# Patient Record
Sex: Female | Born: 1990 | Race: White | Hispanic: No | Marital: Single | Smoking: Former smoker
Health system: Southern US, Community
[De-identification: ages and names within clinical notes are randomized; demographics above are authoritative.]

## PROBLEM LIST (undated history)

## (undated) DIAGNOSIS — M5432 Sciatica, left side: Secondary | ICD-10-CM

## (undated) DIAGNOSIS — M5431 Sciatica, right side: Secondary | ICD-10-CM

## (undated) HISTORY — PX: TUBAL LIGATION: SHX77

## (undated) HISTORY — PX: ABDOMINAL SURGERY: SHX537

---
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
## 2010-03-18 ENCOUNTER — Emergency Department: Payer: Self-pay | Admitting: Emergency Medicine

## 2010-03-22 ENCOUNTER — Emergency Department: Payer: Self-pay | Admitting: Internal Medicine

## 2010-07-29 ENCOUNTER — Emergency Department: Payer: Self-pay | Admitting: Emergency Medicine

## 2010-08-15 ENCOUNTER — Emergency Department: Payer: Self-pay | Admitting: Emergency Medicine

## 2010-08-16 ENCOUNTER — Emergency Department: Payer: Self-pay | Admitting: Emergency Medicine

## 2011-02-17 ENCOUNTER — Ambulatory Visit: Payer: Self-pay | Admitting: Ophthalmology

## 2011-03-11 ENCOUNTER — Emergency Department: Payer: Self-pay | Admitting: Emergency Medicine

## 2011-08-09 ENCOUNTER — Emergency Department: Payer: Self-pay | Admitting: Emergency Medicine

## 2011-11-25 ENCOUNTER — Emergency Department: Payer: Self-pay | Admitting: *Deleted

## 2011-11-25 LAB — URINALYSIS, COMPLETE
Bacteria: NONE SEEN
Bilirubin,UR: NEGATIVE
Blood: NEGATIVE
Glucose,UR: NEGATIVE mg/dL (ref 0–75)
Ketone: NEGATIVE
Ph: 5 (ref 4.5–8.0)
Specific Gravity: 1.023 (ref 1.003–1.030)
Squamous Epithelial: 3
WBC UR: 4 /HPF (ref 0–5)

## 2011-11-25 LAB — COMPREHENSIVE METABOLIC PANEL
Albumin: 3.7 g/dL (ref 3.4–5.0)
Alkaline Phosphatase: 35 U/L — ABNORMAL LOW (ref 50–136)
Anion Gap: 10 (ref 7–16)
BUN: 15 mg/dL (ref 7–18)
Bilirubin,Total: 0.3 mg/dL (ref 0.2–1.0)
Calcium, Total: 8.5 mg/dL (ref 8.5–10.1)
Chloride: 107 mmol/L (ref 98–107)
Co2: 24 mmol/L (ref 21–32)
EGFR (African American): >60
EGFR (Non-African Amer.): >60
Osmolality: 282 (ref 275–301)
SGPT (ALT): 19 U/L
Sodium: 141 mmol/L (ref 136–145)
Total Protein: 7.4 g/dL (ref 6.4–8.2)

## 2011-11-25 LAB — CBC
HGB: 13.5 g/dL (ref 12.0–16.0)
MCHC: 34.1 g/dL (ref 32.0–36.0)
MCV: 94 fL (ref 80–100)
Platelet: 241 10*3/uL (ref 150–440)
RBC: 4.23 10*6/uL (ref 3.80–5.20)
WBC: 6.2 10*3/uL (ref 3.6–11.0)

## 2011-11-26 ENCOUNTER — Emergency Department: Payer: Self-pay | Admitting: Unknown Physician Specialty

## 2011-11-26 LAB — URINALYSIS, COMPLETE
Bilirubin,UR: NEGATIVE
Ketone: NEGATIVE
Leukocyte Esterase: NEGATIVE
Ph: 5 (ref 4.5–8.0)
Protein: NEGATIVE
RBC,UR: NONE SEEN /HPF (ref 0–5)
Squamous Epithelial: 2
WBC UR: 3 /HPF (ref 0–5)

## 2011-11-26 LAB — COMPREHENSIVE METABOLIC PANEL
Albumin: 3.5 g/dL (ref 3.4–5.0)
Anion Gap: 9 (ref 7–16)
Bilirubin,Total: 0.9 mg/dL (ref 0.2–1.0)
Calcium, Total: 8.6 mg/dL (ref 8.5–10.1)
Chloride: 106 mmol/L (ref 98–107)
Co2: 27 mmol/L (ref 21–32)
Creatinine: 0.76 mg/dL (ref 0.60–1.30)
EGFR (African American): >60
EGFR (Non-African Amer.): >60
Osmolality: 283 (ref 275–301)
Potassium: 3.6 mmol/L (ref 3.5–5.1)
SGOT(AST): 16 U/L (ref 15–37)
SGPT (ALT): 19 U/L

## 2011-11-26 LAB — CBC
HCT: 39.9 % (ref 35.0–47.0)
HGB: 13.4 g/dL (ref 12.0–16.0)
MCH: 31.5 pg (ref 26.0–34.0)
MCHC: 33.6 g/dL (ref 32.0–36.0)
MCV: 94 fL (ref 80–100)
Platelet: 226 10*3/uL (ref 150–440)
RBC: 4.25 10*6/uL (ref 3.80–5.20)
RDW: 13.3 % (ref 11.5–14.5)

## 2011-11-26 LAB — TSH: Thyroid Stimulating Horm: 3.36 u[IU]/mL

## 2011-11-26 LAB — PREGNANCY, URINE: Pregnancy Test, Urine: NEGATIVE m[IU]/mL

## 2011-11-26 LAB — DRUG SCREEN, URINE
Barbiturates, Ur Screen: NEGATIVE (ref ?–200)
Benzodiazepine, Ur Scrn: NEGATIVE (ref ?–200)
Cannabinoid 50 Ng, Ur ~~LOC~~: NEGATIVE (ref ?–50)
Cocaine Metabolite,Ur ~~LOC~~: NEGATIVE (ref ?–300)
Methadone, Ur Screen: NEGATIVE (ref ?–300)
Phencyclidine (PCP) Ur S: NEGATIVE (ref ?–25)
Tricyclic, Ur Screen: NEGATIVE (ref ?–1000)

## 2013-08-17 ENCOUNTER — Emergency Department: Payer: Self-pay | Admitting: Emergency Medicine

## 2013-08-17 LAB — BASIC METABOLIC PANEL
Anion Gap: 5 — ABNORMAL LOW (ref 7–16)
Calcium, Total: 8.9 mg/dL (ref 8.5–10.1)
Co2: 26 mmol/L (ref 21–32)
Creatinine: 0.67 mg/dL (ref 0.60–1.30)
EGFR (African American): >60
EGFR (Non-African Amer.): >60
Osmolality: 276 (ref 275–301)
Sodium: 139 mmol/L (ref 136–145)

## 2013-08-17 LAB — URINALYSIS, COMPLETE
Blood: NEGATIVE
Ketone: NEGATIVE
Leukocyte Esterase: NEGATIVE
Nitrite: NEGATIVE
Ph: 7 (ref 4.5–8.0)
Protein: NEGATIVE
Squamous Epithelial: 4
WBC UR: 2 /HPF (ref 0–5)

## 2013-08-17 LAB — PREGNANCY, URINE: Pregnancy Test, Urine: NEGATIVE m[IU]/mL

## 2013-08-17 LAB — CBC
MCHC: 33.6 g/dL (ref 32.0–36.0)
Platelet: 230 10*3/uL (ref 150–440)
RDW: 13.9 % (ref 11.5–14.5)

## 2013-09-18 ENCOUNTER — Ambulatory Visit: Payer: Self-pay | Admitting: Physician Assistant

## 2014-06-11 ENCOUNTER — Ambulatory Visit: Payer: Self-pay | Admitting: Family Medicine

## 2014-10-21 ENCOUNTER — Emergency Department: Payer: Self-pay | Admitting: Emergency Medicine

## 2014-10-21 LAB — CBC WITH DIFFERENTIAL/PLATELET
Basophil #: 0 10*3/uL (ref 0.0–0.1)
Basophil %: 0.4 %
EOS PCT: 0.9 %
Eosinophil #: 0.1 10*3/uL (ref 0.0–0.7)
HCT: 37.7 % (ref 35.0–47.0)
HGB: 12.7 g/dL (ref 12.0–16.0)
LYMPHS PCT: 12.9 %
Lymphocyte #: 1.5 10*3/uL (ref 1.0–3.6)
MCH: 32.2 pg (ref 26.0–34.0)
MCHC: 33.7 g/dL (ref 32.0–36.0)
MCV: 96 fL (ref 80–100)
Monocyte #: 0.8 x10 3/mm (ref 0.2–0.9)
Monocyte %: 6.6 %
Neutrophil #: 9.4 10*3/uL — ABNORMAL HIGH (ref 1.4–6.5)
Neutrophil %: 79.2 %
Platelet: 212 10*3/uL (ref 150–440)
RBC: 3.94 10*6/uL (ref 3.80–5.20)
RDW: 13 % (ref 11.5–14.5)
WBC: 11.8 10*3/uL — ABNORMAL HIGH (ref 3.6–11.0)

## 2014-10-21 LAB — COMPREHENSIVE METABOLIC PANEL
ALBUMIN: 2.3 g/dL — AB (ref 3.4–5.0)
ANION GAP: 7 (ref 7–16)
AST: 18 U/L (ref 15–37)
Alkaline Phosphatase: 120 U/L — ABNORMAL HIGH
BUN: 5 mg/dL — AB (ref 7–18)
Bilirubin,Total: 0.4 mg/dL (ref 0.2–1.0)
CALCIUM: 8.2 mg/dL — AB (ref 8.5–10.1)
CHLORIDE: 107 mmol/L (ref 98–107)
Co2: 25 mmol/L (ref 21–32)
Creatinine: 0.57 mg/dL — ABNORMAL LOW (ref 0.60–1.30)
EGFR (African American): >60
EGFR (Non-African Amer.): >60
GLUCOSE: 87 mg/dL (ref 65–99)
OSMOLALITY: 274 (ref 275–301)
Potassium: 3.8 mmol/L (ref 3.5–5.1)
SGPT (ALT): 20 U/L
Sodium: 139 mmol/L (ref 136–145)
TOTAL PROTEIN: 5.8 g/dL — AB (ref 6.4–8.2)

## 2014-10-21 LAB — URINALYSIS, COMPLETE
BILIRUBIN, UR: NEGATIVE
Blood: NEGATIVE
Glucose,UR: NEGATIVE mg/dL (ref 0–75)
Ketone: NEGATIVE
Leukocyte Esterase: NEGATIVE
Nitrite: NEGATIVE
Ph: 7 (ref 4.5–8.0)
Protein: NEGATIVE
RBC,UR: 1 /HPF (ref 0–5)
SPECIFIC GRAVITY: 1.009 (ref 1.003–1.030)
Squamous Epithelial: 1

## 2014-10-21 LAB — MAGNESIUM: Magnesium: 1.7 mg/dL — ABNORMAL LOW

## 2014-10-23 LAB — URINE CULTURE

## 2014-11-28 ENCOUNTER — Inpatient Hospital Stay: Payer: Self-pay

## 2014-11-28 LAB — CBC WITH DIFFERENTIAL/PLATELET
Basophil #: 0.1 10*3/uL (ref 0.0–0.1)
Basophil %: 0.4 %
EOS PCT: 0.8 %
Eosinophil #: 0.1 10*3/uL (ref 0.0–0.7)
HCT: 39 % (ref 35.0–47.0)
HGB: 13.2 g/dL (ref 12.0–16.0)
Lymphocyte #: 1.8 10*3/uL (ref 1.0–3.6)
Lymphocyte %: 15.3 %
MCH: 32.5 pg (ref 26.0–34.0)
MCHC: 33.8 g/dL (ref 32.0–36.0)
MCV: 96 fL (ref 80–100)
MONO ABS: 1.1 x10 3/mm — AB (ref 0.2–0.9)
Monocyte %: 9.4 %
NEUTROS PCT: 74.1 %
Neutrophil #: 8.5 10*3/uL — ABNORMAL HIGH (ref 1.4–6.5)
PLATELETS: 219 10*3/uL (ref 150–440)
RBC: 4.05 10*6/uL (ref 3.80–5.20)
RDW: 13.4 % (ref 11.5–14.5)
WBC: 11.5 10*3/uL — AB (ref 3.6–11.0)

## 2014-11-29 LAB — GC/CHLAMYDIA PROBE AMP

## 2014-12-01 LAB — HEMATOCRIT: HCT: 38.2 % (ref 35.0–47.0)

## 2015-02-10 ENCOUNTER — Ambulatory Visit: Payer: Self-pay | Admitting: Obstetrics & Gynecology

## 2015-02-17 ENCOUNTER — Ambulatory Visit: Payer: Self-pay | Admitting: Obstetrics & Gynecology

## 2015-03-22 NOTE — Op Note (Signed)
PATIENT NAME:  Kara SchwabBUCKNER, Shakesha L MR#:  [id number] DATE OF BIRTH:  [birth year]  DATE OF PROCEDURE:  02/17/2015  POSTOPERATIVE DIAGNOSIS:  Desire for permanent sterility.   POSTOPERATIVE DIAGNOSIS:   Desire for permanent sterility.   PROCEDURE: Laparoscopy, bilateral tubal ligation using Hulka clips.   SURGEON: Annamarie MajorPaul Euclid Cassetta, MD   ASSISTANT: None.   ANESTHESIA: General.   ESTIMATED BLOOD LOSS: Minimal.   COMPLICATIONS: None.   FINDINGS: Normal tubes, ovaries, and uterus.   DISPOSITION: To recovery room stable.   PROCEDURE IN DETAIL:   The patient is prepped and draped in the usual sterile fashion after adequate anesthesia is obtained in the dorsal lithotomy position. Bladder is drained with a Robinson catheter and sponge stick is placed for manipulation purposes.   Attention is then turned to abdomen, where a Veress needle is inserted through a 5 mm infraumbilical incision after Marcaine is used to anesthetize the skin. Veress needle placement is confirmed using the hanging drop technique and the abdomen is then insufflated with CO2 gas. A 5 mm trocar is then inserted under direct visualization with the laparoscope with no injuries or  bleeding noted. The patient was turned in Trendelenburg position and the above-mentioned findings are visualized. An 11 mm suprapubic port is placed under direct visualization with the laparoscope with no injuries or bleeding noted. A 5 mm right lower quadrant trocar is placed lateral to the inferior epigastric blood vessels with no injuries or bleeding noted. The left fallopian tube is identified out to its fimbria and then in the midportion, a Hulka clip is placed followed by a second just lateral to the first. The right fallopian tube was identified out to its fimbria and a Hulka clip is placed in the midportion of the tube with a 2nd one placed just lateral to the 1st. Excellent placement is noted. No bleeding is noted. No injuries to bowel, bladder, ureter, or  other structures are encountered. The patient is leveled, gas is expelled, and trocars are removed. The fascia at the suprapubic site is closed with a 2-0 Vicryl suture and the skin is closed with Dermabond. The patient goes to the recovery room in stable condition. All sponge, instrument, and needle counts are correct.      ____________________________ R. Annamarie MajorPaul Taron Mondor, MD rph:tr D: 02/17/2015 11:04:42 ET T: 02/17/2015 12:19:32 ET JOB#: [id number]  cc: Dierdre Searles. Paul Teigan Sahli, MD, <Dictator> Nadara MustardOBERT P Add Dinapoli MD ELECTRONICALLY SIGNED 02/17/2015 13:47

## 2015-03-31 NOTE — H&P (Signed)
L&D Evaluation:  History Expanded:  HPI 24 yo G1P0 at 5041 weeks EGA in uncomplicated pregnancy w Prenatal Care at [facility] who presents for Induction of labor due to Post Dates.   Gravida 1   Term 0   Blood Type (Maternal) A positive   Group B Strep Results Maternal (Result >5wks must be treated as unknown) negative   Maternal [facility]Dap Immune   [facility]   Patient's Medical History No Chronic Illness   Patient's Surgical History none   Medications Pre Natal Vitamins   Allergies NKDA   Social History none   Family History Non-Contributory   ROS:  ROS All systems were reviewed.  HEENT, CNS, GI, GU, Respiratory, CV, Renal and Musculoskeletal systems were found to be normal.   Exam:  Vital Signs stable   General no apparent distress   Mental Status clear   Abdomen gravid, non-tender   Estimated Fetal Weight Average for gestational age   Back no CVAT   Edema no edema   Pelvic no external lesions, C/75/-3   Mebranes Intact   FHT normal rate with no decels   Ucx rare   Skin dry   Impression:  Impression Induction of labor for Post Dates   Plan:  Plan EFM/NST, fluids   Comments Cervadil for Induction of labor, pros and cons discussed. Pt has been fully informed of the pros and cons, risk/benefits continued close observation versus the risks of induction. She understands that there are uncommon risks to induction, which include but are not limited to:  frequent and/or prolonged uterine contractions, fetal distress, uterine rupture and lack of success of induction.  She also has been informed that if the induction is not successful a Cesarean Section may be necessary.  All questions have been answered and she is in agreement with induction.   Electronic Signatures: Letitia LibraHarris, Saajan Willmon Paul (MD)  (Signed 08-Jan-16 21:03)  Authored: L&D Evaluation   Last Updated: 08-Jan-16 21:03 by Letitia LibraHarris, Deosha Werden Paul (MD)

## 2017-08-10 ENCOUNTER — Encounter: Payer: Self-pay | Admitting: Emergency Medicine

## 2017-08-10 ENCOUNTER — Emergency Department: Payer: Medicaid Other

## 2017-08-10 ENCOUNTER — Emergency Department
Admission: EM | Admit: 2017-08-10 | Discharge: 2017-08-10 | Disposition: A | Discharge status: Home / Self Care | Payer: Medicaid Other | Attending: Internal Medicine | Admitting: Internal Medicine

## 2017-08-10 DIAGNOSIS — R079 Chest pain, unspecified: Secondary | ICD-10-CM | POA: Insufficient documentation

## 2017-08-10 DIAGNOSIS — F419 Anxiety disorder, unspecified: Secondary | ICD-10-CM | POA: Diagnosis not present

## 2017-08-10 DIAGNOSIS — F17200 Nicotine dependence, unspecified, uncomplicated: Secondary | ICD-10-CM | POA: Diagnosis not present

## 2017-08-10 LAB — BASIC METABOLIC PANEL
Anion gap: 8 (ref 5–15)
BUN: 10 mg/dL (ref 6–20)
CALCIUM: 9.3 mg/dL (ref 8.9–10.3)
CO2: 24 mmol/L (ref 22–32)
Chloride: 106 mmol/L (ref 101–111)
Creatinine, Ser: 0.65 mg/dL (ref 0.44–1.00)
GFR calc Af Amer: >60 mL/min (ref >60)
GLUCOSE: 112 mg/dL — AB (ref 65–99)
Potassium: 3.8 mmol/L (ref 3.5–5.1)
Sodium: 138 mmol/L (ref 135–145)

## 2017-08-10 LAB — TROPONIN I

## 2017-08-10 LAB — CBC
HCT: 43.1 % (ref 35.0–47.0)
Hemoglobin: 15 g/dL (ref 12.0–16.0)
MCH: 33.7 pg (ref 26.0–34.0)
MCHC: 34.7 g/dL (ref 32.0–36.0)
MCV: 97.1 fL (ref 80.0–100.0)
Platelets: 226 10*3/uL (ref 150–440)
RBC: 4.44 MIL/uL (ref 3.80–5.20)
RDW: 12.5 % (ref 11.5–14.5)
WBC: 6.5 10*3/uL (ref 3.6–11.0)

## 2017-08-10 MED ORDER — HYDROXYZINE PAMOATE 25 MG PO CAPS: 25.0000 mg | ORAL_CAPSULE | Freq: Three times a day (TID) | ORAL | 0 refills | Status: AC | PRN

## 2017-08-10 NOTE — Discharge Instructions (Signed)
Your exam, labs, chest x-ray, and EKG were all normal today. You have been experiencing atypical chest pain and anxiety. Your heart is healthy and your lungs are clear. You are being provided with a prescription which may relieve your symptoms. Consider taking the medicine at bedtime. Follow-up with one of the local community clinic for ongoing symptom management.

## 2017-08-10 NOTE — ED Triage Notes (Signed)
Pt to ed with c/o chest pain that started this am, reports intermittent chest pain over the last 2 years.  States unknown onset.  States pain is worse with deep breath.

## 2017-08-10 NOTE — ED Provider Notes (Signed)
[facility] Emergency Department Provider Note ____________________________________________  Time seen: 1331  I have reviewed the triage vital signs and the nursing notes.  HISTORY  Chief Complaint  Chest Pain  History & exam as performed by Rose Clousing, PA-S Sherrie Sport)  HPI Kara Ford is a 26 y.o. female Presents to the ED with complaints of central and left substernal chest pain that started this morning. Patient reports intermittent episodes of chest pain similar to this one over the last 2-3 years.She denies any recent illness, injury, trauma. She also denies any diaphoresis, nausea, vomiting, or syncope. She denies any prodrome preceding chest pain and denies any alleviating or aggravating factors. She describes the pain as sharp & stabbing, lasting anywhere from 2-3 minutes to constant, throughout the day. She denies any correlation between activity, eating, or movement. She gives a remote history of a cardiac work-up including a holter monitor, but she reports a negative work-up.   History reviewed. No pertinent past medical history.  There are no active problems to display for this patient.  History reviewed. No pertinent surgical history.  Prior to Admission medications   Medication Sig Start Date End Date Taking? Authorizing Provider  hydrOXYzine (VISTARIL) 25 MG capsule Take 1 capsule (25 mg total) by mouth every 8 (eight) hours as needed for anxiety. 08/10/17 08/20/17  Demetre Monaco, Charlesetta Ivory, PA-C    Allergies Patient has no known allergies.  History reviewed. No pertinent family history.  Social History Social History  Substance Use Topics  . Smoking status: Current Every Day Smoker  . Smokeless tobacco: Never Used  . Alcohol use Yes    Review of Systems  Constitutional: Negative for fever. Eyes: Negative for visual changes. ENT: Negative for sore throat. Cardiovascular: Positive for chest pain. Respiratory: Negative for  shortness of breath. Gastrointestinal: Negative for abdominal pain, vomiting and diarrhea. Musculoskeletal: Negative for back pain. Chest wall tenderness  Skin: Negative for rash. Neurological: Negative for headaches, focal weakness or numbness. ____________________________________________  PHYSICAL EXAM:  VITAL SIGNS: ED Triage Vitals  Enc Vitals Group     BP 08/10/17 1103 138/76     Pulse Rate 08/10/17 1103 95     Resp 08/10/17 1103 18     Temp 08/10/17 1103 98.2 F (36.8 C)     Temp Source 08/10/17 1103 Oral     SpO2 08/10/17 1103 100 %     Weight 08/10/17 1103 165 lb (74.8 kg)     Height --      Head Circumference --      Peak Flow --      Pain Score 08/10/17 1102 6     Pain Loc --      Pain Edu? --      Excl. in GC? --     Constitutional: Alert and oriented. Well appearing and in no distress. Head: Normocephalic and atraumatic. Neck: Supple. No thyromegaly. Hematological/Lymphatic/Immunological: No cervical lymphadenopathy. Cardiovascular: Normal rate, regular rhythm. Normal distal pulses. No murmur, rubs, gallops. No LE edema noted.  Respiratory: Normal respiratory effort. No wheezes/rales/rhonchi. Gastrointestinal: Soft and nontender. No distention, rebound, guarding, or organomegaly. Normal bowel sounds. No inspiratory halt.  Musculoskeletal: Nontender with normal range of motion in all extremities. Mildly tender to palp over the left lower rib cage.  Neurologic:  Normal gait without ataxia. Normal speech and language. No gross focal neurologic deficits are appreciated. Skin:  Skin is warm, dry and intact. No rash noted. Psychiatric: Mood and affect are normal. Patient exhibits appropriate  insight and judgment. ____________________________________________   LABS (pertinent positives/negatives)  Labs Reviewed  BASIC METABOLIC PANEL - Abnormal; Notable for the following:       Result Value   Glucose, Bld 112 (*)    All other components within normal limits  CBC   TROPONIN I  ____________________________________________  EKG  NSR with sinus arrhythmia Vent rate 90 bpm Normal Axis No STEMI ____________________________________________   RADIOLOGY  CXR  IMPRESSION: Negative chest ____________________________________________  INITIAL IMPRESSION / ASSESSMENT AND PLAN / ED COURSE  Patient with the ED evaluation of chronic, intermittent chest wall pain on presentation. She presents with left-sided substernal chest wall pain. Patient denies any shortness of breath, diaphoresis, nausea, vomiting, or upper extremities referral. Her exam today is overall benign. Her labs are reassuring at this time including a negative troponin. Patient's EKG reveals normal sinus rhythm with a sinus arrhythmia. She is reassured by her exam, labs, and EKG. Patient likely is experiencing intermittent chest pain that does not appear to be cardiac in nature. Symptoms may represent a response to stress and anxiety. She will be discharged at this time with a prescription for hydroxyzine to dose as directed for anxiety and sleep induction. She is referred to the local clinic clinics for ongoing evaluation and symptom management. Return precautions are reviewed. ____________________________________________  FINAL CLINICAL IMPRESSION(S) / ED DIAGNOSES  Final diagnoses:  Nonspecific chest pain  Anxiety      Karmen Stabs, Charlesetta Ivory, PA-C 08/10/17 1921    Emily Filbert, MD 08/22/17 [phone]

## 2017-08-10 NOTE — ED Notes (Signed)
See triage note  States she has had intermittent chest pain for about 2 years  Not sure what causes or stop the pain  States she has not taken any meds for this   Pain increases at time with inspiration   No fever or trauma   Afebrile on arrival

## 2017-10-21 ENCOUNTER — Other Ambulatory Visit: Payer: Self-pay

## 2017-10-21 ENCOUNTER — Emergency Department
Admission: EM | Admit: 2017-10-21 | Discharge: 2017-10-21 | Disposition: A | Discharge status: Home / Self Care | Payer: Medicaid Other | Attending: Emergency Medicine | Admitting: Emergency Medicine

## 2017-10-21 ENCOUNTER — Encounter: Payer: Self-pay | Admitting: Emergency Medicine

## 2017-10-21 DIAGNOSIS — F17200 Nicotine dependence, unspecified, uncomplicated: Secondary | ICD-10-CM | POA: Diagnosis not present

## 2017-10-21 DIAGNOSIS — R05 Cough: Secondary | ICD-10-CM | POA: Diagnosis not present

## 2017-10-21 DIAGNOSIS — J029 Acute pharyngitis, unspecified: Secondary | ICD-10-CM | POA: Diagnosis present

## 2017-10-21 DIAGNOSIS — Z79899 Other long term (current) drug therapy: Secondary | ICD-10-CM | POA: Diagnosis not present

## 2017-10-21 DIAGNOSIS — R059 Cough, unspecified: Secondary | ICD-10-CM

## 2017-10-21 LAB — POCT RAPID STREP A: STREPTOCOCCUS, GROUP A SCREEN (DIRECT): NEGATIVE

## 2017-10-21 MED ORDER — GUAIFENESIN-CODEINE 100-10 MG/5ML PO SOLN: 10.0000 mL | Freq: Three times a day (TID) | ORAL | 0 refills | Status: DC | PRN

## 2017-10-21 MED ORDER — AMOXICILLIN 500 MG PO TABS: 500.0000 mg | ORAL_TABLET | Freq: Three times a day (TID) | ORAL | 0 refills | Status: DC

## 2017-10-21 NOTE — Discharge Instructions (Signed)
Follow up with the primary care provider of your choice for symptoms that are not resolving over the week.  Return to the ER for symptoms that change or worsen if unable to schedule an appointment.

## 2017-10-21 NOTE — ED Triage Notes (Signed)
Pt c/o cough X 1 week and started with sore throat yesterday AM. No known fevers.  reports tonsils swollen.  Denies pain at this time.  Ambulatory. NAD. VSS. Handling secretions. Unlabored.

## 2017-10-21 NOTE — ED Provider Notes (Signed)
[facility] Emergency Department Provider Note  ____________________________________________  Time seen: Approximately 9:29 AM  I have reviewed the triage vital signs and the nursing notes.   HISTORY  Chief Complaint Cough and Sore Throat   HPI Kara Ford is a 26 y.o. female who presents to the emergency department for evaluation and treatment of cough x 1 week and sore throat that started yesterday. She states that she is "sure it's strep throat" because she has had it many times and saw "craters" on her tonsils this morning. Cough is productive of clear/yellow mucus. She has not taken anything OTC for her symptoms.    History reviewed. No pertinent past medical history.  There are no active problems to display for this patient.   History reviewed. No pertinent surgical history.  Prior to Admission medications   Medication Sig Start Date End Date Taking? Authorizing Provider  Prenatal Vit-Fe Fumarate-FA (MULTIVITAMIN-PRENATAL) 27-0.8 MG TABS tablet Take 1 tablet by mouth daily at 12 noon.   Yes [provider]  amoxicillin (AMOXIL) 500 MG tablet Take 1 tablet (500 mg total) by mouth 3 (three) times daily. 10/21/17   Phillip Maffei B, FNP  guaiFENesin-codeine 100-10 MG/5ML syrup Take 10 mLs by mouth 3 (three) times daily as needed. 10/21/17   Chinita Pesterriplett, Cherrie Franca B, FNP    Allergies Patient has no known allergies.  History reviewed. No pertinent family history.  Social History Social History   Tobacco Use  . Smoking status: Current Every Day Smoker  . Smokeless tobacco: Never Used  Substance Use Topics  . Alcohol use: Yes  . Drug use: No    Review of Systems Constitutional: Negative for fever/chills ENT: Positive sore throat. Cardiovascular: Denies chest pain. Respiratory: Negative for shortness of breath. Positive for cough. Gastrointestinal: Negative for nausea,  no vomiting.  No diarrhea.  Musculoskeletal: Negative for body  aches Skin: Negative for rash. Neurological: Negative for headaches ____________________________________________   PHYSICAL EXAM:  VITAL SIGNS: ED Triage Vitals [10/21/17 0905]  Enc Vitals Group     BP 124/65     Pulse Rate 79     Resp 16     Temp 98.4 F (36.9 C)     Temp Source Oral     SpO2 97 %     Weight 165 lb (74.8 kg)     Height 5\' 7"  (1.702 m)     Head Circumference      Peak Flow      Pain Score      Pain Loc      Pain Edu?      Excl. in GC?     Constitutional: Alert and oriented. Well appearing and in no acute distress. Eyes: Conjunctivae are normal. EOMI. Ears: bilateral TM normal Nose: No congestion noted; no rhinnorhea. Mouth/Throat: Mucous membranes are moist.  Oropharynx erythematous. Tonsils 1+without exudate. Neck: No stridor.  Lymphatic: No cervical lymphadenopathy. Cardiovascular: Normal rate, regular rhythm. Good peripheral circulation. Respiratory: Normal respiratory effort.  No retractions. Clear to auscultation. Gastrointestinal: Soft and nontender.  Musculoskeletal: FROM x 4 extremities.  Neurologic:  Normal speech and language.  Skin:  Skin is warm, dry and intact. No rash noted. Psychiatric: Mood and affect are normal. Speech and behavior are normal.  ____________________________________________   LABS (all labs ordered are listed, but only abnormal results are displayed)  Labs Reviewed  POCT RAPID STREP A   ____________________________________________  EKG  Not indicated. ____________________________________________  RADIOLOGY  Not indicated. ____________________________________________   PROCEDURES  Procedure(s) performed: None  Critical Care performed: No ____________________________________________   INITIAL IMPRESSION / ASSESSMENT AND PLAN / ED COURSE  26 year old female presenting to the ER for treatment of cough and sore throat. She is not reassured by a negative rapid strep screen as she feels confident that  she has strep throat. Therefore, she will be treated with amoxicillin and given Robitussin AC for cough. She was instructed to follow up with the PCP of her choice for symptoms that are not improving over the next few days or return to the ER for symptoms that change or worsen if unable to schedule an appointment.  Medications - No data to display  ED Discharge Orders        Ordered    amoxicillin (AMOXIL) 500 MG tablet  3 times daily     10/21/17 0937    guaiFENesin-codeine 100-10 MG/5ML syrup  3 times daily PRN     10/21/17 [id number]      Pertinent labs & imaging results that were available during my care of the patient were reviewed by me and considered in my medical decision making (see chart for details).    If controlled substance prescribed during this visit, 12 month history viewed on the [facility] prior to issuing an initial prescription for Schedule II or III opiod. ____________________________________________   FINAL CLINICAL IMPRESSION(S) / ED DIAGNOSES  Final diagnoses:  Pharyngitis, unspecified etiology  Cough    Note:  This document was prepared using Dragon voice recognition software and may include unintentional dictation errors.     Chinita Pesterriplett, Ranesha Val B, FNP 10/21/17 [id number]    Sharyn CreamerQuale, Mark, MD 10/21/17 1515

## 2017-10-21 NOTE — ED Notes (Signed)
See triage note  Presents with cough for about 1 week  Developed sore throat yesterday  Having increased pain with swallowing. Afebrile at present

## 2019-05-04 ENCOUNTER — Emergency Department: Payer: Medicaid Other

## 2019-05-04 ENCOUNTER — Emergency Department
Admission: EM | Admit: 2019-05-04 | Discharge: 2019-05-04 | Disposition: A | Discharge status: Home / Self Care | Payer: Medicaid Other | Attending: Emergency Medicine | Admitting: Emergency Medicine

## 2019-05-04 ENCOUNTER — Other Ambulatory Visit: Payer: Self-pay

## 2019-05-04 ENCOUNTER — Encounter: Payer: Self-pay | Admitting: Emergency Medicine

## 2019-05-04 DIAGNOSIS — N760 Acute vaginitis: Secondary | ICD-10-CM | POA: Insufficient documentation

## 2019-05-04 DIAGNOSIS — B9689 Other specified bacterial agents as the cause of diseases classified elsewhere: Secondary | ICD-10-CM | POA: Insufficient documentation

## 2019-05-04 DIAGNOSIS — F17210 Nicotine dependence, cigarettes, uncomplicated: Secondary | ICD-10-CM | POA: Diagnosis not present

## 2019-05-04 DIAGNOSIS — Z79899 Other long term (current) drug therapy: Secondary | ICD-10-CM | POA: Insufficient documentation

## 2019-05-04 DIAGNOSIS — N390 Urinary tract infection, site not specified: Secondary | ICD-10-CM | POA: Insufficient documentation

## 2019-05-04 DIAGNOSIS — R102 Pelvic and perineal pain: Secondary | ICD-10-CM

## 2019-05-04 LAB — URINALYSIS, COMPLETE (UACMP) WITH MICROSCOPIC
Bilirubin Urine: NEGATIVE
Glucose, UA: NEGATIVE mg/dL
Ketones, ur: NEGATIVE mg/dL
Nitrite: POSITIVE — AB
Protein, ur: 100 mg/dL — AB
Specific Gravity, Urine: 1.013 (ref 1.005–1.030)
WBC, UA: >50 WBC/hpf — ABNORMAL HIGH (ref 0–5)
pH: 7 (ref 5.0–8.0)

## 2019-05-04 LAB — COMPREHENSIVE METABOLIC PANEL
ALT: 111 U/L — ABNORMAL HIGH (ref 0–44)
AST: 153 U/L — ABNORMAL HIGH (ref 15–41)
Albumin: 4.6 g/dL (ref 3.5–5.0)
Alkaline Phosphatase: 60 U/L (ref 38–126)
Anion gap: 15 (ref 5–15)
BUN: 6 mg/dL (ref 6–20)
CO2: 21 mmol/L — ABNORMAL LOW (ref 22–32)
Calcium: 9.2 mg/dL (ref 8.9–10.3)
Chloride: 102 mmol/L (ref 98–111)
Creatinine, Ser: 0.68 mg/dL (ref 0.44–1.00)
GFR calc Af Amer: >60 mL/min (ref >60)
GFR calc non Af Amer: >60 mL/min (ref >60)
Glucose, Bld: 88 mg/dL (ref 70–99)
Potassium: 3.8 mmol/L (ref 3.5–5.1)
Sodium: 138 mmol/L (ref 135–145)
Total Bilirubin: 1 mg/dL (ref 0.3–1.2)
Total Protein: 7.8 g/dL (ref 6.5–8.1)

## 2019-05-04 LAB — CBC
HCT: 43.4 % (ref 36.0–46.0)
Hemoglobin: 14.8 g/dL (ref 12.0–15.0)
MCH: 33.6 pg (ref 26.0–34.0)
MCHC: 34.1 g/dL (ref 30.0–36.0)
MCV: 98.4 fL (ref 80.0–100.0)
Platelets: 242 10*3/uL (ref 150–400)
RBC: 4.41 MIL/uL (ref 3.87–5.11)
RDW: 12.2 % (ref 11.5–15.5)
WBC: 8.5 10*3/uL (ref 4.0–10.5)
nRBC: 0 % (ref 0.0–0.2)

## 2019-05-04 LAB — LIPASE, BLOOD: Lipase: 26 U/L (ref 11–51)

## 2019-05-04 LAB — WET PREP, GENITAL
Sperm: NONE SEEN
Trich, Wet Prep: NONE SEEN
Yeast Wet Prep HPF POC: NONE SEEN

## 2019-05-04 LAB — POCT PREGNANCY, URINE: Preg Test, Ur: NEGATIVE

## 2019-05-04 LAB — CHLAMYDIA/NGC RT PCR (ARMC ONLY): Chlamydia Tr: NOT DETECTED

## 2019-05-04 LAB — CHLAMYDIA/NGC RT PCR (ARMC ONLY)??????????: N gonorrhoeae: NOT DETECTED

## 2019-05-04 MED ORDER — CEPHALEXIN 500 MG PO CAPS: 500.0000 mg | ORAL_CAPSULE | Freq: Four times a day (QID) | ORAL | 0 refills | Status: AC

## 2019-05-04 MED ORDER — ONDANSETRON HCL 4 MG/2ML IJ SOLN
INTRAMUSCULAR | Status: AC
  Filled 2019-05-04: qty 2

## 2019-05-04 MED ORDER — HYDROMORPHONE HCL 1 MG/ML IJ SOLN: 1.0000 mg | Freq: Once | INTRAMUSCULAR | Status: DC

## 2019-05-04 MED ORDER — METRONIDAZOLE 500 MG PO TABS: 500.0000 mg | ORAL_TABLET | Freq: Three times a day (TID) | ORAL | 0 refills | Status: AC

## 2019-05-04 MED ORDER — ONDANSETRON HCL 4 MG/2ML IJ SOLN
4.0000 mg | Freq: Once | INTRAMUSCULAR | Status: AC
  Administered 2019-05-04: 4 mg via INTRAVENOUS

## 2019-05-04 MED ORDER — HYDROMORPHONE HCL 1 MG/ML IJ SOLN
1.0000 mg | Freq: Once | INTRAMUSCULAR | Status: AC
  Administered 2019-05-04: 14:00:00 1 mg via INTRAVENOUS

## 2019-05-04 MED ORDER — TRAMADOL HCL 50 MG PO TABS: 50.0000 mg | ORAL_TABLET | Freq: Two times a day (BID) | ORAL | 0 refills | Status: DC | PRN

## 2019-05-04 MED ORDER — SODIUM CHLORIDE 0.9 % IV SOLN
1.0000 g | Freq: Once | INTRAVENOUS | Status: AC
  Administered 2019-05-04: 1 g via INTRAVENOUS
  Filled 2019-05-04: qty 10

## 2019-05-04 MED ORDER — HYDROMORPHONE HCL 1 MG/ML IJ SOLN
INTRAMUSCULAR | Status: AC
  Administered 2019-05-04: 1 mg via INTRAVENOUS
  Filled 2019-05-04: qty 1

## 2019-05-04 NOTE — ED Provider Notes (Signed)
Pelvic examination performed with nurse Judson Roch.  External exam normal.  Internal exam minimal discomfort, no cervical motion tenderness.  Reports some slight discomfort minimally in both adnexa.  No lesions noted.  No foul smell or odor.   Delman Kitten, MD 05/04/19 1430

## 2019-05-04 NOTE — ED Provider Notes (Signed)
Medical screening examination/treatment/procedure(s) were conducted as a shared visit with non-physician practitioner(s) and myself.  I personally evaluated the patient during the encounter.  Patient on examination has notable discomfort and suprapubically.  She is felt that she may have urinary tract infection based on some dysuria she is been experiencing the last couple of days.  She been tried to self treat at home with juices, but symptoms worsening.  Does have a chronic history of lower pelvic pain as well.  Moderately tender across the lower abdomen.  No rebound or guarding.  Nontoxic, stable vital signs.  Proceed with transvaginal ultrasound.  ----------------------------------------- 3:20 PM on 05/04/2019 -----------------------------------------  Ongoing care and disposition assigned Dr. Anell Barr, follow-up on transvaginal ultrasound and reassessment.  If notable ongoing abdominal pain on reexamination, will consider CT abdomen and pelvis prior to disposition, however if pain well controlled I suspect urinary tract infection plus bacterial vaginosis are present.  PA Mardee Postin also continue to follow   Delman Kitten, MD 05/04/19 1520

## 2019-05-04 NOTE — ED Notes (Signed)
Patient transported to Ultrasound 

## 2019-05-04 NOTE — ED Triage Notes (Signed)
Pt arrived via POV with reports of lower abdominal pain for several years, pt states she had a surgical procedure years ago as a form of birth control and states the pain has worsened over the past few months.  Pt states last menstural cycle was last week and lasted 6-7 days.  Pt denies any N/V.

## 2019-05-04 NOTE — ED Provider Notes (Signed)
[facility] Emergency Department Provider Note   ____________________________________________   None    (approximate)  I have reviewed the triage vital signs and the nursing notes.   HISTORY  Chief Complaint Abdominal Pain    HPI Kara Ford is a 28 y.o. female patient states intermittent lower abdominal pain for several years.  Patient states pain began status post surgical procedure for birth control.  Patient has not a tubal ligation.  Patient stated tubes were clamped.  Patient the pain is worse in the past few months.  Patient denies nausea, vomiting, diarrhea.  Patient last menstrual period was normal duration last week.  States pain is bilateral and radiates to her back.  Patient rates the pain as a 10/10 today.  No palliative measure for complaint.  Patient described the pain as "achy/crampy".      History reviewed. No pertinent past medical history.  There are no active problems to display for this patient.   Past Surgical History:  Procedure Laterality Date   ABDOMINAL SURGERY      Prior to Admission medications   Medication Sig Start Date End Date Taking? Authorizing Provider  amoxicillin (AMOXIL) 500 MG tablet Take 1 tablet (500 mg total) by mouth 3 (three) times daily. 10/21/17   Triplett, Cari B, FNP  guaiFENesin-codeine 100-10 MG/5ML syrup Take 10 mLs by mouth 3 (three) times daily as needed. 10/21/17   Triplett, Johnette Abraham B, FNP  Prenatal Vit-Fe Fumarate-FA (MULTIVITAMIN-PRENATAL) 27-0.8 MG TABS tablet Take 1 tablet by mouth daily at 12 noon.    [provider]    Allergies Patient has no known allergies.  History reviewed. No pertinent family history.  Social History Social History   Tobacco Use   Smoking status: Current Some Day Smoker    Types: Cigarettes   Smokeless tobacco: Never Used  Substance Use Topics   Alcohol use: Yes   Drug use: No    Review of Systems Constitutional: No  fever/chills Eyes: No visual changes. ENT: No sore throat. Cardiovascular: Denies chest pain. Respiratory: Denies shortness of breath. Gastrointestinal: No abdominal pain.  No nausea, no vomiting.  No diarrhea.  No constipation. Genitourinary: Dysuria pelvic pain Musculoskeletal: Bilateral flank pain Skin: Negative for rash. Neurological: Negative for headaches, focal weakness or numbness.   ____________________________________________   PHYSICAL EXAM:  VITAL SIGNS: ED Triage Vitals  Enc Vitals Group     BP 05/04/19 1142 132/81     Pulse Rate 05/04/19 1142 93     Resp 05/04/19 1142 18     Temp 05/04/19 1142 98.2 F (36.8 C)     Temp Source 05/04/19 1142 Oral     SpO2 05/04/19 1142 96 %     Weight 05/04/19 1142 198 lb (89.8 kg)     Height 05/04/19 1142 5\' 8"  (1.727 m)     Head Circumference --      Peak Flow --      Pain Score 05/04/19 1140 10     Pain Loc --      Pain Edu? --      Excl. in [place]? --     Constitutional: Alert and oriented.  Moderate distress.   Eyes: Conjunctivae are normal. PERRL. EOMI. Cardiovascular: Normal rate, regular rhythm. Grossly normal heart sounds.  Good peripheral circulation. Respiratory: Normal respiratory effort.  No retractions. Lungs CTAB. Gastrointestinal: Soft with moderate guarding palpation.. No distention. No abdominal bruits. No CVA tenderness. Genitourinary: Deferred Musculoskeletal: No lower extremity tenderness nor edema.  No  joint effusions. Neurologic:  Normal speech and language. No gross focal neurologic deficits are appreciated. No gait instability. Skin:  Skin is warm, dry and intact. No rash noted. Psychiatric: Mood and affect are normal. Speech and behavior are normal.  ____________________________________________   LABS (all labs ordered are listed, but only abnormal results are displayed)  Labs Reviewed  WET PREP, GENITAL - Abnormal; Notable for the following components:      Result Value   Clue Cells Wet Prep  HPF POC PRESENT (*)    WBC, Wet Prep HPF POC FEW (*)    All other components within normal limits  COMPREHENSIVE METABOLIC PANEL - Abnormal; Notable for the following components:   CO2 21 (*)    AST 153 (*)    ALT 111 (*)    All other components within normal limits  URINALYSIS, COMPLETE (UACMP) WITH MICROSCOPIC - Abnormal; Notable for the following components:   Color, Urine AMBER (*)    APPearance CLOUDY (*)    Hgb urine dipstick MODERATE (*)    Protein, ur 100 (*)    Nitrite POSITIVE (*)    Leukocytes,Ua LARGE (*)    WBC, UA >50 (*)    Bacteria, UA FEW (*)    All other components within normal limits  URINE CULTURE  CHLAMYDIA/NGC RT PCR ([facility] ONLY)  LIPASE, BLOOD  CBC  POC URINE PREG, ED  POCT PREGNANCY, URINE   ____________________________________________  EKG   ____________________________________________  RADIOLOGY  ED MD interpretation:    Official radiology report(s): [place] Pelvis Transvanginal Non-ob (tv Only)  Result Date: 05/04/2019 CLINICAL DATA:  Increasing pelvic pain for 1 week. EXAM: TRANSABDOMINAL AND TRANSVAGINAL ULTRASOUND OF PELVIS TECHNIQUE: Both transabdominal and transvaginal ultrasound examinations of the pelvis were performed. Transabdominal technique was performed for global imaging of the pelvis including uterus, ovaries, adnexal regions, and pelvic cul-de-sac. It was necessary to proceed with endovaginal exam following the transabdominal exam to visualize the uterus, endometrium and ovaries to better advantage. COMPARISON:  None FINDINGS: Uterus Measurements: 6.7 x 2.9 x 3.4 cm = volume: 35 mL. No fibroids or other mass visualized. Endometrium Thickness: 4 mm.  No focal abnormality visualized. Right ovary Measurements: 3.3 x 1.8 x 2.5 cm = volume: 8 mL. Normal appearance/no adnexal mass. Left ovary Measurements: 2.5 x 2.0 x 1.5 cm = volume: 4.1 mL. Normal appearance/no adnexal mass. Other findings No abnormal free fluid. IMPRESSION: 1. Normal  transabdominal and endovaginal pelvic ultrasound. Electronically Signed   By: Amie Portlandavid  Ormond M.D.   On: 05/04/2019 15:32   [place] Pelvis Complete  Result Date: 05/04/2019 CLINICAL DATA:  Increasing pelvic pain for 1 week. EXAM: TRANSABDOMINAL AND TRANSVAGINAL ULTRASOUND OF PELVIS TECHNIQUE: Both transabdominal and transvaginal ultrasound examinations of the pelvis were performed. Transabdominal technique was performed for global imaging of the pelvis including uterus, ovaries, adnexal regions, and pelvic cul-de-sac. It was necessary to proceed with endovaginal exam following the transabdominal exam to visualize the uterus, endometrium and ovaries to better advantage. COMPARISON:  None FINDINGS: Uterus Measurements: 6.7 x 2.9 x 3.4 cm = volume: 35 mL. No fibroids or other mass visualized. Endometrium Thickness: 4 mm.  No focal abnormality visualized. Right ovary Measurements: 3.3 x 1.8 x 2.5 cm = volume: 8 mL. Normal appearance/no adnexal mass. Left ovary Measurements: 2.5 x 2.0 x 1.5 cm = volume: 4.1 mL. Normal appearance/no adnexal mass. Other findings No abnormal free fluid. IMPRESSION: 1. Normal transabdominal and endovaginal pelvic ultrasound. Electronically Signed   By: Renard Hamperavid  Ormond M.D.  On: 05/04/2019 15:32    ____________________________________________   PROCEDURES  Procedure(s) performed (including Critical Care):  Procedures   ____________________________________________   INITIAL IMPRESSION / ASSESSMENT AND PLAN / ED COURSE  As part of my medical decision making, I reviewed the following data within the electronic MEDICAL RECORD NUMBER         Patient presents with increasing abdominal pain.  Patient the pain started few years ago status post clamping of her fallopian tubes.  Patient has not seen any medical provider for this complaint.  Patient stated pain is worse in the past few months.  Differential diagnosis consists of a urinary tract infection/PID.  Due to the duration of this  pain a pelvic ultrasound will be initiated with labs.   ____________________________________________   FINAL CLINICAL IMPRESSION(S) / ED DIAGNOSES  Final diagnoses:  Lower urinary tract infectious disease  Bacterial vaginitis  Pelvic pain in female     ED Discharge Orders    None       Note:  This document was prepared using Dragon voice recognition software and may include unintentional dictation errors.    Joni ReiningSmith, Lamario Mani K, PA-C 05/04/19 1552    Shaune PollackIsaacs, [place], MD 05/05/19 [phone]

## 2019-05-04 NOTE — ED Notes (Signed)
Pt reports being dizzy and it's causing her nausea. MD notified and ginger ale given. Lights turned off as well.

## 2019-05-06 LAB — URINE CULTURE
Culture: >=100000 — AB
Special Requests: NORMAL

## 2021-01-30 ENCOUNTER — Encounter: Payer: Self-pay | Admitting: Emergency Medicine

## 2021-01-30 ENCOUNTER — Other Ambulatory Visit: Payer: Self-pay

## 2021-01-30 ENCOUNTER — Emergency Department: Payer: Medicaid Other

## 2021-01-30 ENCOUNTER — Emergency Department
Admission: EM | Admit: 2021-01-30 | Discharge: 2021-01-30 | Disposition: A | Discharge status: Home / Self Care | Payer: Medicaid Other | Attending: Emergency Medicine | Admitting: Emergency Medicine

## 2021-01-30 DIAGNOSIS — F17210 Nicotine dependence, cigarettes, uncomplicated: Secondary | ICD-10-CM | POA: Insufficient documentation

## 2021-01-30 DIAGNOSIS — N921 Excessive and frequent menstruation with irregular cycle: Secondary | ICD-10-CM | POA: Diagnosis not present

## 2021-01-30 DIAGNOSIS — R102 Pelvic and perineal pain: Secondary | ICD-10-CM

## 2021-01-30 LAB — CBC
HCT: 42.3 % (ref 36.0–46.0)
Hemoglobin: 14.3 g/dL (ref 12.0–15.0)
MCH: 34 pg (ref 26.0–34.0)
MCHC: 33.8 g/dL (ref 30.0–36.0)
MCV: 100.7 fL — ABNORMAL HIGH (ref 80.0–100.0)
Platelets: 285 10*3/uL (ref 150–400)
RBC: 4.2 MIL/uL (ref 3.87–5.11)
RDW: 11.9 % (ref 11.5–15.5)
WBC: 8.7 10*3/uL (ref 4.0–10.5)
nRBC: 0 % (ref 0.0–0.2)

## 2021-01-30 LAB — BASIC METABOLIC PANEL
Anion gap: 9 (ref 5–15)
BUN: 6 mg/dL (ref 6–20)
CO2: 24 mmol/L (ref 22–32)
Calcium: 9 mg/dL (ref 8.9–10.3)
Chloride: 108 mmol/L (ref 98–111)
Creatinine, Ser: 0.71 mg/dL (ref 0.44–1.00)
GFR, Estimated: >60 mL/min (ref >60)
Glucose, Bld: 95 mg/dL (ref 70–99)
Potassium: 3.5 mmol/L (ref 3.5–5.1)
Sodium: 141 mmol/L (ref 135–145)

## 2021-01-30 LAB — POC URINE PREG, ED: Preg Test, Ur: NEGATIVE

## 2021-01-30 MED ORDER — KETOROLAC TROMETHAMINE 30 MG/ML IJ SOLN
30.0000 mg | Freq: Once | INTRAMUSCULAR | Status: AC
  Administered 2021-01-30: 30 mg via INTRAMUSCULAR
  Filled 2021-01-30: qty 1

## 2021-01-30 MED ORDER — DROPERIDOL 2.5 MG/ML IJ SOLN
2.5000 mg | Freq: Once | INTRAMUSCULAR | Status: AC
  Administered 2021-01-30: 2.5 mg via INTRAMUSCULAR
  Filled 2021-01-30: qty 2

## 2021-01-30 NOTE — Discharge Instructions (Addendum)
Please take Tylenol and ibuprofen/Advil for your pain.  It is safe to take them together, or to alternate them every few hours.  Take up to 1000mg  of Tylenol at a time, up to 4 times per day.  Do not take more than 4000 mg of Tylenol in 24 hours.  For ibuprofen, take 400-600 mg, 4-5 times per day.  If you develop any significantly worsening bleeding, fevers, passing out, please return to the ED.

## 2021-01-30 NOTE — ED Provider Notes (Signed)
[facility] Emergency Department Provider Note ____________________________________________   Event Date/Time   First MD Initiated Contact with Patient 01/30/21 1332     (approximate)  I have reviewed the triage vital signs and the nursing notes.  HISTORY  Chief Complaint Pelvic Pain   HPI Kara Ford is a 30 y.o. femalewho presents to the ED for evaluation of chronic pelvic pain.   Chart review indicates no relevant medical hx.  Patient reports that she is a G1, P1 s/p bilateral tubal ligation.  She presents to the ED for evaluation of 6 months of bilateral pelvic pain, prolonged menstrual periods that are more irregular.  She reports concern for endometriosis, after discussing her symptoms with multiple female friends.  She reports that she had regular menstrual periods previously, but they have been less regular now, lasting for longer periods of time and with heavier bleeding.  She reports a degree of irregularity with it as well.  She denies any other vaginal discharge beyond her bleeding, itching or burning sensations.  Denies dysuria, diarrhea, hematuria, hematochezia, emesis, fever, chest pain or shortness of breath.  She reports concern explicitly for endometriosis and is requesting an ultrasound.  She reports that she is currently on her menstrual period, starting 2 days ago.  History reviewed. No pertinent past medical history.  There are no problems to display for this patient.   Past Surgical History:  Procedure Laterality Date  . ABDOMINAL SURGERY      Prior to Admission medications   Medication Sig Start Date End Date Taking? Authorizing Provider  amoxicillin (AMOXIL) 500 MG tablet Take 1 tablet (500 mg total) by mouth 3 (three) times daily. 10/21/17   Triplett, Cari B, FNP  guaiFENesin-codeine 100-10 MG/5ML syrup Take 10 mLs by mouth 3 (three) times daily as needed. 10/21/17   Triplett, Rulon Eisenmenger B, FNP  Prenatal Vit-Fe Fumarate-FA  (MULTIVITAMIN-PRENATAL) 27-0.8 MG TABS tablet Take 1 tablet by mouth daily at 12 noon.    [provider]  traMADol (ULTRAM) 50 MG tablet Take 1 tablet (50 mg total) by mouth every 12 (twelve) hours as needed. 05/04/19   Joni Reining, PA-C    Allergies Patient has no known allergies.  No family history on file.  Social History Social History   Tobacco Use  . Smoking status: Current Some Day Smoker    Types: Cigarettes, E-cigarettes  . Smokeless tobacco: Never Used  Vaping Use  . Vaping Use: Some days  Substance Use Topics  . Alcohol use: Yes  . Drug use: No    Review of Systems  Constitutional: No fever/chills Eyes: No visual changes. ENT: No sore throat. Cardiovascular: Denies chest pain. Respiratory: Denies shortness of breath. Gastrointestinal:  no vomiting.  No diarrhea.  No constipation. Positive for chronic lower abdominal/pelvic pain. Genitourinary: Negative for dysuria.  Positive for vaginal bleeding. Musculoskeletal: Negative for back pain. Skin: Negative for rash. Neurological: Negative for headaches, focal weakness or numbness.   ____________________________________________   PHYSICAL EXAM:  VITAL SIGNS: Vitals:   01/30/21 1306  BP: 125/65  Pulse: 72  Resp: 15  Temp: 98.8 F (37.1 C)  SpO2: 95%    Constitutional: Alert and oriented. Well appearing and in no acute distress. Eyes: Conjunctivae are normal. PERRL. EOMI. Head: Atraumatic. Nose: No congestion/rhinnorhea. Mouth/Throat: Mucous membranes are moist.  Oropharynx non-erythematous. Neck: No stridor. No cervical spine tenderness to palpation. Cardiovascular: Normal rate, regular rhythm. Grossly normal heart sounds.  Good peripheral circulation. Respiratory: Normal respiratory effort.  No retractions. Lungs CTAB. Gastrointestinal: Soft , nondistended. No CVA tenderness. Minimal lower quadrant diffuse tenderness to palpation without localizing or peritoneal  features. Musculoskeletal: No lower extremity tenderness nor edema.  No joint effusions. No signs of acute trauma. Neurologic:  Normal speech and language. No gross focal neurologic deficits are appreciated. No gait instability noted. Skin:  Skin is warm, dry and intact. No rash noted. Psychiatric: Mood and affect are normal. Speech and behavior are normal. ____________________________________________   LABS (all labs ordered are listed, but only abnormal results are displayed)  Labs Reviewed  CBC - Abnormal; Notable for the following components:      Result Value   MCV 100.7 (*)    All other components within normal limits  BASIC METABOLIC PANEL  POC URINE PREG, ED    ____________________________________________  RADIOLOGY  ED MD interpretation:    Official radiology report(s): US PELVIC COMPLETE WITH TRANSVAGINAL  Result Date: 01/30/2021 CLINICAL DATA:  Pelvic pain for 6 months EXAM: TRANSABDOMINAL AND TRANSVAGINAL ULTRASOUND OF PELVIS TECHNIQUE: Both transabdominal and transvaginal ultrasound examinations of the pelvis were performed. Transabdominal technique was performed for global imaging of the pelvis including uterus, ovaries, adnexal regions, and pelvic cul-de-sac. It was necessary to proceed with endovaginal exam following the transabdominal exam to visualize the ovaries. COMPARISON:  Is 05/04/2019 FINDINGS: Uterus Measurements: 7.9 x 3.2 x 4.5 cm = volume: 60 mL. No fibroids or other mass visualized. Endometrium Thickness: 8 mm. No focal abnormality visualized. Prominent vascularity by color Doppler. Right ovary Measurements: 2.7 x 1.7 x 2.6 cm = volume: 6 mL. Normal appearance/no adnexal mass. Left ovary Measurements: 3.4 x 2.2 x 2.1 cm = volume: 8 mL. Normal appearance/no adnexal mass. Other findings No abnormal free fluid. IMPRESSION: 1. Essentially unremarkable ultrasound of the pelvis. 2. Mildly prominent endometrial vascularity, likely related to phase of menstrual cycle.  No endometrial thickening or focal lesion. Electronically Signed   By: Duanne Guess D.O.   On: 01/30/2021 14:53    ____________________________________________   PROCEDURES and INTERVENTIONS  Procedure(s) performed (including Critical Care):  Procedures  Medications  ketorolac (TORADOL) 30 MG/ML injection 30 mg (30 mg Intramuscular Given 01/30/21 1414)  droperidol (INAPSINE) 2.5 MG/ML injection 2.5 mg (2.5 mg Intramuscular Given 01/30/21 1414)    ____________________________________________   MDM / ED COURSE   Otherwise healthy 30 year old female presents to the ED with complaints of changes to her menstrual period, with symptoms of menometrorrhagia, without evidence of acute pathology, and amenable to outpatient management with OB/GYN follow-up.  Normal vitals on room air.  Exam demonstrates mild and poorly localizing tenderness to palpation throughout her lower abdomen, and otherwise without acute features.  She looks well clinically.  Blood work shows normal hemoglobin.  Patient is no signs or symptoms of other bleeding pathologies.  Patient is not pregnant.  TVUS demonstrates no uterine, endometrial or other pathology.  No signs of endometriosis on this examination.  We discussed close follow-up with OB/GYN as an outpatient and we discussed return precautions for the ED.  Patient medically stable for outpatient management.   Clinical Course as of 01/30/21 1512  Sat Jan 30, 2021  1510 Reassessed.  Patient reports resolution of symptoms and is requesting discharge.  We discussed reassuring TVUS and recommendations to follow-up with OB/GYN as outpatient.  She is agreeable.  Answered questions. [DS]    Clinical Course User Index [DS] Delton Prairie, MD    ____________________________________________   FINAL CLINICAL IMPRESSION(S) / ED DIAGNOSES  Final diagnoses:  Menometrorrhagia  ED Discharge Orders    None       Lakishia Bourassa Katrinka Blazing   Note:  This document was prepared  using Dragon voice recognition software and may include unintentional dictation errors.   Delton Prairie, MD 01/30/21 1515

## 2021-01-30 NOTE — ED Triage Notes (Signed)
Pt in via POV, reports worsening pelvic pain x approximately 6 months, states, "I need to be checked for endometriosis but have not had time to get in with my doctor."  Reports irregular periods as well with heavy bleeding.  Vitals WDL, NAD noted at this time.

## 2021-02-02 ENCOUNTER — Other Ambulatory Visit (HOSPITAL_COMMUNITY): Payer: Self-pay | Admitting: Adult Health

## 2021-02-02 ENCOUNTER — Other Ambulatory Visit: Payer: Self-pay | Admitting: Adult Health

## 2021-02-02 DIAGNOSIS — N939 Abnormal uterine and vaginal bleeding, unspecified: Secondary | ICD-10-CM

## 2021-07-01 ENCOUNTER — Other Ambulatory Visit: Payer: Self-pay

## 2021-07-01 ENCOUNTER — Emergency Department: Payer: Medicaid Other

## 2021-07-01 ENCOUNTER — Emergency Department
Admission: EM | Admit: 2021-07-01 | Discharge: 2021-07-01 | Disposition: A | Discharge status: Home / Self Care | Payer: Medicaid Other | Attending: Emergency Medicine | Admitting: Emergency Medicine

## 2021-07-01 DIAGNOSIS — M5416 Radiculopathy, lumbar region: Secondary | ICD-10-CM | POA: Diagnosis not present

## 2021-07-01 DIAGNOSIS — F17210 Nicotine dependence, cigarettes, uncomplicated: Secondary | ICD-10-CM | POA: Diagnosis not present

## 2021-07-01 DIAGNOSIS — M545 Low back pain, unspecified: Secondary | ICD-10-CM | POA: Diagnosis present

## 2021-07-01 MED ORDER — KETOROLAC TROMETHAMINE 60 MG/2ML IM SOLN
60.0000 mg | Freq: Once | INTRAMUSCULAR | Status: AC
  Administered 2021-07-01: 60 mg via INTRAMUSCULAR
  Filled 2021-07-01: qty 2

## 2021-07-01 MED ORDER — METHYLPREDNISOLONE 4 MG PO TBPK: ORAL_TABLET | ORAL | 0 refills | Status: DC

## 2021-07-01 MED ORDER — BACLOFEN 10 MG PO TABS: 10.0000 mg | ORAL_TABLET | Freq: Three times a day (TID) | ORAL | 0 refills | Status: AC

## 2021-07-01 NOTE — Discharge Instructions (Addendum)
Follow-up with Dr. Rosita Kea.  Please call for an appointment.  Return emergency department worsening, take the medication as prescribed

## 2021-07-01 NOTE — ED Notes (Signed)
Patient transported to X-ray 

## 2021-07-01 NOTE — ED Notes (Signed)
Pt back from x-ray.

## 2021-07-01 NOTE — ED Triage Notes (Signed)
Pt comes with c/o lower back pain and hip. Pt states she  thinks it sciatic.

## 2021-07-01 NOTE — ED Provider Notes (Signed)
[facility] Emergency Department Provider Note  ____________________________________________   Event Date/Time   First MD Initiated Contact with Patient 07/01/21 0900     (approximate)  I have reviewed the triage vital signs and the nursing notes.   HISTORY  Chief Complaint Back Pain    HPI Kara Ford is a 30 y.o. female  C/o low back pain for 7 day, no known injury, pain is worse with movement, increased with bending over, c/o numbness, tingling,  denies changes in bowel/urinary habits,  Using otc meds without relief Remainder ros neg   History reviewed. No pertinent past medical history.  There are no problems to display for this patient.   Past Surgical History:  Procedure Laterality Date   ABDOMINAL SURGERY      Prior to Admission medications   Medication Sig Start Date End Date Taking? Authorizing Provider  baclofen (LIORESAL) 10 MG tablet Take 1 tablet (10 mg total) by mouth 3 (three) times daily for 7 days. 07/01/21 07/08/21 Yes Dalten Ambrosino, Roselyn Bering, PA-C  methylPREDNISolone (MEDROL DOSEPAK) 4 MG TBPK tablet Take 6 pills on day one then decrease by 1 pill each day 07/01/21  Yes Tia Gelb, Roselyn Bering, PA-C  amoxicillin (AMOXIL) 500 MG tablet Take 1 tablet (500 mg total) by mouth 3 (three) times daily. 10/21/17   Triplett, Cari B, FNP  guaiFENesin-codeine 100-10 MG/5ML syrup Take 10 mLs by mouth 3 (three) times daily as needed. 10/21/17   Triplett, Rulon Eisenmenger B, FNP  Prenatal Vit-Fe Fumarate-FA (MULTIVITAMIN-PRENATAL) 27-0.8 MG TABS tablet Take 1 tablet by mouth daily at 12 noon.    [provider]  traMADol (ULTRAM) 50 MG tablet Take 1 tablet (50 mg total) by mouth every 12 (twelve) hours as needed. 05/04/19   Joni Reining, PA-C    Allergies Patient has no known allergies.  No family history on file.  Social History Social History   Tobacco Use   Smoking status: Some Days    Types: Cigarettes, E-cigarettes   Smokeless tobacco: Never   Vaping Use   Vaping Use: Some days  Substance Use Topics   Alcohol use: Yes   Drug use: No    Review of Systems  Constitutional: No fever/chills Eyes: No visual changes. ENT: No sore throat. Respiratory: Denies cough Genitourinary: Negative for dysuria. Musculoskeletal: Positive for back pain. Skin: Negative for rash.    ____________________________________________   PHYSICAL EXAM:  VITAL SIGNS: ED Triage Vitals  Enc Vitals Group     BP 07/01/21 0859 126/80     Pulse Rate 07/01/21 0859 75     Resp 07/01/21 0859 17     Temp 07/01/21 0859 97.7 F (36.5 C)     Temp Source 07/01/21 0859 Oral     SpO2 07/01/21 0859 99 %     Weight 07/01/21 0859 165 lb (74.8 kg)     Height 07/01/21 0859 5\' 7"  (1.702 m)     Head Circumference --      Peak Flow --      Pain Score 07/01/21 0848 5     Pain Loc --      Pain Edu? --      Excl. in [place]? --     Constitutional: Alert and oriented. Well appearing and in no acute distress. Eyes: Conjunctivae are normal.  Head: Atraumatic. Nose: No congestion/rhinnorhea. Mouth/Throat: Mucous membranes are moist.   Neck:  supple no lymphadenopathy noted Cardiovascular: Normal rate, regular rhythm. Heart sounds are normal Respiratory: Normal respiratory effort.  No retractions, lungs c t a  Abd: soft nontender bs normal all 4 quad GU: deferred Musculoskeletal: FROM all extremities, warm and well perfused.  Decreased rom of back due to discomfort, lumbar spine tender at the SI joint on the left, positive slr, decreased strength in the left foot when compared to the right, neurovascular is intact  Neurologic:  Normal speech and language.  Skin:  Skin is warm, dry and intact. No rash noted. Psychiatric: Mood and affect are normal. Speech and behavior are normal.  ____________________________________________   LABS (all labs ordered are listed, but only abnormal results are displayed)  Labs Reviewed - No data to  display  ____________________________________________   ____________________________________________  RADIOLOGY  X-ray lumbar spine  ____________________________________________   PROCEDURES  Procedure(s) performed: Toradol 60 mg IM   Procedures    ____________________________________________   INITIAL IMPRESSION / ASSESSMENT AND PLAN / ED COURSE  Pertinent labs & imaging results that were available during my care of the patient were reviewed by me and considered in my medical decision making (see chart for details).   The patient is a 30 year old female presents emergency department with low back pain radiates to the left leg with numbness and tingling.  This has been a chronic problem but has increased over the last week  X-ray lumbar spine, Toradol 60 mg IM  X-ray of the lumbar spine reviewed by me from primary radiology.  Radiologist comments on Schmorl node.  I do think this is more appropriate as patient does have herniated disc site symptoms.  I explained all the findings to her.  encouraged her to follow-up with orthopedics as I feel she needs an MRI of her lumbar spine.  Medrol Dosepak and baclofen to help decrease the inflammation and relax the muscles.  Explained to her this may make her feel better but she should still follow-up with orthopedics due to the amount of time she has had this pain.  She states she understands.  Discharged in stable condition.  As part of my medical decision making, I reviewed the following data within the electronic MEDICAL RECORD NUMBER Nursing notes reviewed and incorporated, Old chart reviewed, Radiograph reviewed , Notes from prior ED visits, and [place] Controlled Substance Database  ____________________________________________   FINAL CLINICAL IMPRESSION(S) / ED DIAGNOSES  Final diagnoses:  Lumbar radiculopathy      NEW MEDICATIONS STARTED DURING THIS VISIT:  New Prescriptions   BACLOFEN (LIORESAL) 10 MG TABLET    Take 1 tablet  (10 mg total) by mouth 3 (three) times daily for 7 days.   METHYLPREDNISOLONE (MEDROL DOSEPAK) 4 MG TBPK TABLET    Take 6 pills on day one then decrease by 1 pill each day     Note:  This document was prepared using Dragon voice recognition software and may include unintentional dictation errors.     Faythe Ghee, PA-C 07/01/21 1032    Minna Antis, MD 07/01/21 1409

## 2021-07-04 ENCOUNTER — Emergency Department
Admission: EM | Admit: 2021-07-04 | Discharge: 2021-07-04 | Disposition: A | Discharge status: Home / Self Care | Payer: Medicaid Other | Attending: Emergency Medicine | Admitting: Emergency Medicine

## 2021-07-04 ENCOUNTER — Other Ambulatory Visit: Payer: Self-pay

## 2021-07-04 DIAGNOSIS — M5416 Radiculopathy, lumbar region: Secondary | ICD-10-CM | POA: Insufficient documentation

## 2021-07-04 DIAGNOSIS — M5442 Lumbago with sciatica, left side: Secondary | ICD-10-CM | POA: Insufficient documentation

## 2021-07-04 DIAGNOSIS — M5432 Sciatica, left side: Secondary | ICD-10-CM

## 2021-07-04 DIAGNOSIS — M545 Low back pain, unspecified: Secondary | ICD-10-CM | POA: Diagnosis present

## 2021-07-04 DIAGNOSIS — F17210 Nicotine dependence, cigarettes, uncomplicated: Secondary | ICD-10-CM | POA: Insufficient documentation

## 2021-07-04 MED ORDER — ORPHENADRINE CITRATE 30 MG/ML IJ SOLN
60.0000 mg | INTRAMUSCULAR | Status: AC
  Administered 2021-07-04: 60 mg via INTRAMUSCULAR
  Filled 2021-07-04: qty 2

## 2021-07-04 MED ORDER — LIDOCAINE 5 % EX PTCH: 1.0000 | MEDICATED_PATCH | Freq: Two times a day (BID) | CUTANEOUS | 0 refills | Status: AC | PRN

## 2021-07-04 MED ORDER — DICLOFENAC SODIUM 75 MG PO TBEC: 75.0000 mg | DELAYED_RELEASE_TABLET | Freq: Two times a day (BID) | ORAL | 0 refills | Status: AC

## 2021-07-04 MED ORDER — CYCLOBENZAPRINE HCL 5 MG PO TABS: 5.0000 mg | ORAL_TABLET | Freq: Three times a day (TID) | ORAL | 0 refills | Status: DC | PRN

## 2021-07-04 NOTE — Discharge Instructions (Addendum)
Your exam is consistent with some radicular pain, that caused by some nerve irritation at the L3-4 level.  You should take the anti-inflammatory muscle accident as prescribed.  Follow-up with Ortho as referred, or return to the ED if needed.

## 2021-07-04 NOTE — ED Provider Notes (Signed)
[facility] Emergency Department Provider Note ____________________________________________  Time seen: 1239  I have reviewed the triage vital signs and the nursing notes.  HISTORY  Chief Complaint  Back Pain   HPI Kara Ford is a 30 y.o. female presents her self to the ED for subsequent evaluation of ongoing low back pain with left lower extremity referral.  Patient was evaluated in the ED last week, for acute radicular symptoms.  She was evaluated at the time with an x-ray which confirmed some possible Schmorl node and degenerative disc changes at L3-4.  Patient reports that she initially had relief with the prednisone that was provided, but as she tapered down the pain began to recur.  She denies any bladder or bowel incontinence, foot drop, or saddle anesthesia.  She endorses pain to the anterior thigh as well as her current symptoms of left sciatic nerve irritation down the posterior thigh.  She has not been able to secure an appointment with Ortho as recommended, but reports she missed a phone call from the office last week and will make contact tomorrow. She denies any interim, falls, or weakness.  History reviewed. No pertinent past medical history.  There are no problems to display for this patient.   Past Surgical History:  Procedure Laterality Date   ABDOMINAL SURGERY      Prior to Admission medications   Medication Sig Start Date End Date Taking? Authorizing Provider  cyclobenzaprine (FLEXERIL) 5 MG tablet Take 1-2 tablets (5-10 mg total) by mouth 3 (three) times daily as needed. 07/04/21  Yes Lichelle Viets, Charlesetta Ivory, PA-C  diclofenac (VOLTAREN) 75 MG EC tablet Take 1 tablet (75 mg total) by mouth 2 (two) times daily for 15 days. 07/04/21 07/19/21 Yes Omaria Plunk, Charlesetta Ivory, PA-C  lidocaine (LIDODERM) 5 % Place 1 patch onto the skin every 12 (twelve) hours as needed for up to 10 days. Remove & Discard patch after 12 hours of wear each day.  07/04/21 07/14/21 Yes Jayro Mcmath, Charlesetta Ivory, PA-C  baclofen (LIORESAL) 10 MG tablet Take 1 tablet (10 mg total) by mouth 3 (three) times daily for 7 days. 07/01/21 07/08/21  Fisher, Roselyn Bering, PA-C  methylPREDNISolone (MEDROL DOSEPAK) 4 MG TBPK tablet Take 6 pills on day one then decrease by 1 pill each day 07/01/21   Faythe Ghee, PA-C  Prenatal Vit-Fe Fumarate-FA (MULTIVITAMIN-PRENATAL) 27-0.8 MG TABS tablet Take 1 tablet by mouth daily at 12 noon.    [provider]    Allergies Patient has no known allergies.  History reviewed. No pertinent family history.  Social History Social History   Tobacco Use   Smoking status: Some Days    Types: Cigarettes, E-cigarettes   Smokeless tobacco: Never  Vaping Use   Vaping Use: Some days  Substance Use Topics   Alcohol use: Yes   Drug use: No    Review of Systems  Constitutional: Negative for fever. Cardiovascular: Negative for chest pain. Respiratory: Negative for shortness of breath. Gastrointestinal: Negative for abdominal pain, vomiting and diarrhea. Genitourinary: Negative for dysuria. Musculoskeletal: Positive for back pain with LLE referral. Skin: Negative for rash. Neurological: Negative for headaches, focal weakness or numbness. ____________________________________________  PHYSICAL EXAM:  VITAL SIGNS: ED Triage Vitals  Enc Vitals Group     BP 07/04/21 1131 118/74     Pulse Rate 07/04/21 1131 83     Resp 07/04/21 1131 16     Temp 07/04/21 1131 98.1 F (36.7 C)  Temp Source 07/04/21 1131 Oral     SpO2 07/04/21 1131 99 %     Weight 07/04/21 1127 165 lb (74.8 kg)     Height 07/04/21 1127 5' 7.5" (1.715 m)     Head Circumference --      Peak Flow --      Pain Score 07/04/21 1127 10     Pain Loc --      Pain Edu? --      Excl. in GC? --     Constitutional: Alert and oriented. Well appearing and in no distress. Head: Normocephalic and atraumatic. Cardiovascular: Normal rate, regular rhythm. Normal  distal pulses. Respiratory: Normal respiratory effort. No wheezes/rales/rhonchi. Gastrointestinal: Soft and nontender. No distention. Musculoskeletal: Normal spinal alignment without midline tenderness, spasm, deformity, or step-off.  Patient with tenderness to palpation over the anterior left thigh at the quadriceps insertion.  She is able demonstrate normal flexion and extension range of the leg.  Normal lumbar flexion extension in standing position.  Nontender with normal range of motion in all extremities.  Neurologic: Cranial nerves II through XII grossly intact.  Normal LE DTRs bilaterally.  Normal toe dorsiflexion on exam.  Negative supine straight leg raise bilaterally.  Normal gait without ataxia. Normal speech and language. No gross focal neurologic deficits are appreciated. Skin:  Skin is warm, dry and intact. No rash noted. Psychiatric: Mood and affect are normal. Patient exhibits appropriate insight and judgment. ____________________________________________    {LABS (pertinent positives/negatives)  ____________________________________________  {EKG  ____________________________________________   RADIOLOGY Official radiology report(s): No results found. ____________________________________________  PROCEDURES  Norflex 60 mg IM  Procedures ____________________________________________   INITIAL IMPRESSION / ASSESSMENT AND PLAN / ED COURSE  As part of my medical decision making, I reviewed the following data within the electronic MEDICAL RECORD NUMBER Notes from prior ED visits   DDX: lumbar radiculopathy, sciatica, DDD  Patient with subsequent ED evaluation of ongoing low back pain with left lower extremity referral.  Returns due to ongoing pain and limited benefit from the previously prescribed steroid.  Exam is overall benign and reassuring as it shows no acute neuromuscular deficits or red flags on exam.  Patient does have symptoms of radiculopathy likely related to the  L3-4 distribution.  She will be discharged with a prescription for diclofenac as well as Flexeril and lieu of her previously prescribed baclofen.  She is encouraged to follow-up with Ortho for further evaluation and management.  Work is provided as requested.   Kara Ford was evaluated in Emergency Department on 07/04/2021 for the symptoms described in the history of present illness. She was evaluated in the context of the global COVID-19 pandemic, which necessitated consideration that the patient might be at risk for infection with the SARS-CoV-2 virus that causes COVID-19. Institutional protocols and algorithms that pertain to the evaluation of patients at risk for COVID-19 are in a state of rapid change based on information released by regulatory bodies including the CDC and federal and state organizations. These policies and algorithms were followed during the patient's care in the ED. ____________________________________________  FINAL CLINICAL IMPRESSION(S) / ED DIAGNOSES  Final diagnoses:  Sciatica of left side  Lumbar radicular pain      Nicodemus Denk, Charlesetta Ivory, PA-C 07/04/21 1505    Georga Hacking, MD 07/04/21 1753

## 2021-07-04 NOTE — ED Triage Notes (Signed)
Pt arrived to ed via pov from home. Pt states seen here last Thursday and told she had sciatica and a herniated disc. Pt states told to come back if pain did not improve. Ambulatory to triage. NAD noted

## 2021-07-23 ENCOUNTER — Other Ambulatory Visit: Payer: Self-pay

## 2021-07-23 ENCOUNTER — Emergency Department
Admission: EM | Admit: 2021-07-23 | Discharge: 2021-07-23 | Disposition: A | Discharge status: Home / Self Care | Payer: Medicaid Other | Attending: Emergency Medicine | Admitting: Emergency Medicine

## 2021-07-23 ENCOUNTER — Encounter: Payer: Self-pay | Admitting: Emergency Medicine

## 2021-07-23 DIAGNOSIS — M79605 Pain in left leg: Secondary | ICD-10-CM | POA: Diagnosis not present

## 2021-07-23 DIAGNOSIS — F17210 Nicotine dependence, cigarettes, uncomplicated: Secondary | ICD-10-CM | POA: Insufficient documentation

## 2021-07-23 DIAGNOSIS — M5431 Sciatica, right side: Secondary | ICD-10-CM | POA: Insufficient documentation

## 2021-07-23 DIAGNOSIS — M5432 Sciatica, left side: Secondary | ICD-10-CM | POA: Diagnosis not present

## 2021-07-23 DIAGNOSIS — M79604 Pain in right leg: Secondary | ICD-10-CM | POA: Diagnosis present

## 2021-07-23 LAB — POC URINE PREG, ED: Preg Test, Ur: NEGATIVE

## 2021-07-23 MED ORDER — ORPHENADRINE CITRATE 30 MG/ML IJ SOLN
60.0000 mg | Freq: Two times a day (BID) | INTRAMUSCULAR | Status: DC
  Administered 2021-07-23: 60 mg via INTRAMUSCULAR
  Filled 2021-07-23: qty 2

## 2021-07-23 MED ORDER — OXYCODONE-ACETAMINOPHEN 7.5-325 MG PO TABS: 1.0000 | ORAL_TABLET | Freq: Four times a day (QID) | ORAL | 0 refills | Status: DC | PRN

## 2021-07-23 MED ORDER — METHYLPREDNISOLONE SODIUM SUCC 125 MG IJ SOLR
125.0000 mg | Freq: Once | INTRAMUSCULAR | Status: AC
  Administered 2021-07-23: 125 mg via INTRAMUSCULAR
  Filled 2021-07-23: qty 2

## 2021-07-23 MED ORDER — ORPHENADRINE CITRATE ER 100 MG PO TB12: 100.0000 mg | ORAL_TABLET | Freq: Two times a day (BID) | ORAL | 0 refills | Status: DC

## 2021-07-23 MED ORDER — METHYLPREDNISOLONE 4 MG PO TBPK: ORAL_TABLET | ORAL | 0 refills | Status: DC

## 2021-07-23 MED ORDER — OXYCODONE-ACETAMINOPHEN 5-325 MG PO TABS
1.0000 | ORAL_TABLET | Freq: Once | ORAL | Status: AC
  Administered 2021-07-23: 1 via ORAL
  Filled 2021-07-23: qty 1

## 2021-07-23 NOTE — ED Notes (Signed)
See triage note  Presents with lower back pain  States she was seen and dx'd with sciatica last week  States pain is getting worse  Ambulates with limp d/t pain states prednisone and flexeril not helping

## 2021-07-23 NOTE — ED Notes (Signed)
Pt had a vagal response after IM injections. Pt states she became lightheaded. Pt back on stretcher to rest. Windy Fast PA aware

## 2021-07-23 NOTE — Discharge Instructions (Addendum)
Read and follow discharge care instruction.  Take medication as directed.  Call orthopedic clinic listed on your discharge care instruction for 4 PM today.  Tell them you a follow-up from the emergency room.

## 2021-07-23 NOTE — ED Notes (Signed)
Pt feeling and looking better.

## 2021-07-23 NOTE — ED Provider Notes (Signed)
[facility] Emergency Department Provider Note   ____________________________________________   Event Date/Time   First MD Initiated Contact with Patient 07/23/21 1402     (approximate)  I have reviewed the triage vital signs and the nursing notes.   HISTORY  Chief Complaint Back Pain    HPI Kara Ford is a 30 y.o. female patient presents with radicular pain to the bilateral lower extremities.  Patient states initial complaints down the left side but now has radiated source including both lower extremities.  Patient was seen twice for this complaint and advised to follow-up with orthopedic for definitive evaluation and treatment.  Patient that she is have not gotten a call back from orthopedics.  Patient has bladder bowel dysfunction.  Patient said her job requires prolonged standing and walking.  Patient rates her pain as 10/10.  Patient described pain as "shooting".  Patient states moderate relief with a steroid Dosepak given 2 weeks ago.  X-rays taken on 07/01/2021 shows mild superior endplate irregularity at L3 and disc height loss of L2 and 3.         History reviewed. No pertinent past medical history.  There are no problems to display for this patient.   Past Surgical History:  Procedure Laterality Date   ABDOMINAL SURGERY      Prior to Admission medications   Medication Sig Start Date End Date Taking? Authorizing Provider  methylPREDNISolone (MEDROL DOSEPAK) 4 MG TBPK tablet Take Tapered dose as directed 07/23/21  Yes Joni Reining, PA-C  orphenadrine (NORFLEX) 100 MG tablet Take 1 tablet (100 mg total) by mouth 2 (two) times daily. 07/23/21  Yes Joni Reining, PA-C  oxyCODONE-acetaminophen (PERCOCET) 7.5-325 MG tablet Take 1 tablet by mouth every 6 (six) hours as needed. 07/23/21  Yes Joni Reining, PA-C  cyclobenzaprine (FLEXERIL) 5 MG tablet Take 1-2 tablets (5-10 mg total) by mouth 3 (three) times daily as needed. 07/04/21    Menshew, Charlesetta Ivory, PA-C  Prenatal Vit-Fe Fumarate-FA (MULTIVITAMIN-PRENATAL) 27-0.8 MG TABS tablet Take 1 tablet by mouth daily at 12 noon.    [provider]    Allergies Patient has no known allergies.  No family history on file.  Social History Social History   Tobacco Use   Smoking status: Some Days    Types: Cigarettes, E-cigarettes   Smokeless tobacco: Never  Vaping Use   Vaping Use: Some days  Substance Use Topics   Alcohol use: Yes   Drug use: No    Review of Systems Constitutional: No fever/chills Eyes: No visual changes. ENT: No sore throat. Cardiovascular: Denies chest pain. Respiratory: Denies shortness of breath. Gastrointestinal: No abdominal pain.  No nausea, no vomiting.  No diarrhea.  No constipation. Genitourinary: Negative for dysuria. Musculoskeletal: Positive for back pain. Skin: Negative for rash. Neurological: Shooting pain bilateral lower extremity.   ____________________________________________   PHYSICAL EXAM:  VITAL SIGNS: ED Triage Vitals  Enc Vitals Group     BP 07/23/21 1246 121/74     Pulse Rate 07/23/21 1246 82     Resp 07/23/21 1246 17     Temp 07/23/21 1246 98.5 F (36.9 C)     Temp Source 07/23/21 1246 Oral     SpO2 07/23/21 1246 100 %     Weight 07/23/21 1246 160 lb (72.6 kg)     Height 07/23/21 1246 5\' 7"  (1.702 m)     Head Circumference --      Peak Flow --  Pain Score 07/23/21 1251 10     Pain Loc --      Pain Edu? --      Excl. in GC? --     Constitutional: Alert and oriented. Well appearing and in no acute distress. Eyes: Conjunctivae are normal. PERRL. EOMI. Head: Atraumatic. Nose: No congestion/rhinnorhea. Mouth/Throat: Mucous membranes are moist.  Oropharynx non-erythematous. Neck: No stridor.  No cervical spine tenderness to palpation. Hematological/Lymphatic/Immunilogical: No cervical lymphadenopathy. Cardiovascular: Normal rate, regular rhythm. Grossly normal heart sounds.  Good  peripheral circulation. Respiratory: Normal respiratory effort.  No retractions. Lungs CTAB. Gastrointestinal: Soft and nontender. No distention. No abdominal bruits. No CVA tenderness. Genitourinary: Deferred Musculoskeletal: No obvious lumbar spine deformity.  No guarding with palpation spinal processes.  Patient has full and equal range of motion of the cervical spine.  Patient negative straight leg test in the supine position. Neurologic:  Normal speech and language. No gross focal neurologic deficits are appreciated. No gait instability. Skin:  Skin is warm, dry and intact. No rash noted. Psychiatric: Mood and affect are normal. Speech and behavior are normal.  ____________________________________________   LABS (all labs ordered are listed, but only abnormal results are displayed)  Labs Reviewed  POC URINE PREG, ED   ____________________________________________  EKG   ____________________________________________  RADIOLOGY I, Joni Reining, personally viewed and evaluated these images (plain radiographs) as part of my medical decision making, as well as reviewing the written report by the radiologist.  ED MD interpretation:    Official radiology report(s): No results found.  ____________________________________________   PROCEDURES  Procedure(s) performed (including Critical Care):  Procedures   ____________________________________________   INITIAL IMPRESSION / ASSESSMENT AND PLAN / ED COURSE  As part of my medical decision making, I reviewed the following data within the electronic MEDICAL RECORD NUMBER         Patient presents for medical pain to the l lower extremities.  Review of patient x-rays reveal degenerative changes.  Patient advised to follow-up orthopedic for definitive evaluation and treatment.  Patient given discharge care instructions and a prescription for Medrol Dosepak, Norflex, and Percocets.      ____________________________________________   FINAL CLINICAL IMPRESSION(S) / ED DIAGNOSES  Final diagnoses:  Bilateral sciatica     ED Discharge Orders          Ordered    orphenadrine (NORFLEX) 100 MG tablet  2 times daily        07/23/21 1420    methylPREDNISolone (MEDROL DOSEPAK) 4 MG TBPK tablet        07/23/21 1420    oxyCODONE-acetaminophen (PERCOCET) 7.5-325 MG tablet  Every 6 hours PRN        07/23/21 1420             Note:  This document was prepared using Dragon voice recognition software and may include unintentional dictation errors.    Joni Reining, PA-C 07/23/21 1429    Chesley Noon, MD 07/23/21 [phone]

## 2021-07-23 NOTE — ED Triage Notes (Signed)
Pt arrived POV from home, for sciatica and a heniated disc. She is having increased pain and is now on the left side in her "butt cheek" as well as the right. She states that nothing is helping with the pain

## 2021-09-23 ENCOUNTER — Emergency Department
Admission: EM | Admit: 2021-09-23 | Discharge: 2021-09-23 | Disposition: A | Discharge status: Home / Self Care | Payer: Medicaid Other | Attending: Emergency Medicine | Admitting: Emergency Medicine

## 2021-09-23 ENCOUNTER — Encounter: Payer: Self-pay | Admitting: *Deleted

## 2021-09-23 ENCOUNTER — Other Ambulatory Visit: Payer: Self-pay

## 2021-09-23 DIAGNOSIS — Z5321 Procedure and treatment not carried out due to patient leaving prior to being seen by health care provider: Secondary | ICD-10-CM | POA: Insufficient documentation

## 2021-09-23 DIAGNOSIS — M545 Low back pain, unspecified: Secondary | ICD-10-CM | POA: Diagnosis present

## 2021-09-23 NOTE — ED Notes (Signed)
No answer when called for a room.  No answer on pts phone when attempted to call her

## 2021-09-23 NOTE — ED Triage Notes (Signed)
Pt has lower back pain for 1 day.  Taking otc meds without relief.  Pt alert  no known injury to back  denies urinary sx.

## 2021-09-23 NOTE — ED Notes (Signed)
No answer in lobby or flex wait when called for room.  x3

## 2021-09-23 NOTE — ED Notes (Signed)
Called several times from lobby with no answer 

## 2021-12-23 ENCOUNTER — Emergency Department: Payer: Medicaid Other

## 2021-12-23 ENCOUNTER — Emergency Department
Admission: EM | Admit: 2021-12-23 | Discharge: 2021-12-23 | Disposition: A | Discharge status: Home / Self Care | Payer: Medicaid Other | Attending: Emergency Medicine | Admitting: Emergency Medicine

## 2021-12-23 ENCOUNTER — Other Ambulatory Visit: Payer: Self-pay

## 2021-12-23 DIAGNOSIS — M545 Low back pain, unspecified: Secondary | ICD-10-CM | POA: Diagnosis not present

## 2021-12-23 DIAGNOSIS — M25562 Pain in left knee: Secondary | ICD-10-CM | POA: Diagnosis present

## 2021-12-23 DIAGNOSIS — W19XXXA Unspecified fall, initial encounter: Secondary | ICD-10-CM | POA: Diagnosis not present

## 2021-12-23 MED ORDER — MELOXICAM 15 MG PO TABS: 15.0000 mg | ORAL_TABLET | Freq: Every day | ORAL | 2 refills | Status: AC

## 2021-12-23 MED ORDER — KETOROLAC TROMETHAMINE 30 MG/ML IJ SOLN
30.0000 mg | Freq: Once | INTRAMUSCULAR | Status: AC
  Administered 2021-12-23: 30 mg via INTRAMUSCULAR
  Filled 2021-12-23: qty 1

## 2021-12-23 MED ORDER — METHOCARBAMOL 500 MG PO TABS: 500.0000 mg | ORAL_TABLET | Freq: Three times a day (TID) | ORAL | 0 refills | Status: AC | PRN

## 2021-12-23 NOTE — ED Provider Notes (Signed)
[facility] Provider Note  Patient Contact: 3:25 PM (approximate)   History   Fall   HPI  Kara Ford is a 31 y.o. female presents to the emergency department with left knee pain and low back pain after a mechanical fall a week ago.  Patient states that she has a history of herniated disc of the lumbar spine as well as bone spurs and became concerned for possible new injury.  She has been ambulating easily.  She denies bowel or bladder incontinence or saddle anesthesia.      Physical Exam   Triage Vital Signs: ED Triage Vitals  Enc Vitals Group     BP 12/23/21 1339 137/72     Pulse Rate 12/23/21 1339 79     Resp 12/23/21 1339 16     Temp 12/23/21 1339 98.6 F (37 C)     Temp Source 12/23/21 1339 Oral     SpO2 12/23/21 1339 94 %     Weight 12/23/21 1350 167 lb 1.7 oz (75.8 kg)     Height 12/23/21 1350 5\' 7"  (1.702 m)     Head Circumference --      Peak Flow --      Pain Score --      Pain Loc --      Pain Edu? --      Excl. in GC? --     Most recent vital signs: Vitals:   12/23/21 1339  BP: 137/72  Pulse: 79  Resp: 16  Temp: 98.6 F (37 C)  SpO2: 94%     General: Alert and in no acute distress. Eyes:  PERRL. EOMI. Head: No acute traumatic findings ENT:      Ears:       Nose: No congestion/rhinnorhea.      Mouth/Throat: Mucous membranes are moist. Neck: No stridor. No cervical spine tenderness to palpation. Cardiovascular:  Good peripheral perfusion Respiratory: Normal respiratory effort without tachypnea or retractions. Lungs CTAB. Good air entry to the bases with no decreased or absent breath sounds. Gastrointestinal: Bowel sounds 4 quadrants. Soft and nontender to palpation. No guarding or rigidity. No palpable masses. No distention. No CVA tenderness. Musculoskeletal: Full range of motion to all extremities.  Neurologic:  No gross focal neurologic deficits are appreciated.  Skin:   No rash noted Other:   ED  Results / Procedures / Treatments   Labs (all labs ordered are listed, but only abnormal results are displayed) Labs Reviewed  POC URINE PREG, ED        RADIOLOGY  I personally viewed and evaluated these images as part of my medical decision making, as well as reviewing the written report by the radiologist.  ED Provider Interpretation: I personally reviewed x-rays of the lumbar spine and left knee and no acute bony abnormalities were visualized.   PROCEDURES:  Critical Care performed:   Procedures   MEDICATIONS ORDERED IN ED: Medications  ketorolac (TORADOL) 30 MG/ML injection 30 mg (30 mg Intramuscular Given 12/23/21 1614)     IMPRESSION / MDM / ASSESSMENT AND PLAN / ED COURSE  I reviewed the triage vital signs and the nursing notes.                              Differential diagnosis includes, but is not limited to, musculoskeletal pain, muscle spasm, lumbar strain, knee sprain, knee effusion...  Assessment and plan Fall:  31 year old female presents to the  emergency department after a mechanical fall a week ago.  Vital signs were reassuring at triage.  Patient had no neurodeficits on exam.  I personally reviewed x-rays of the lumbar spine and the left knee and no acute bony abnormalities were visualized.  Patient was given an injection of Toradol for pain.  She was discharged with meloxicam and Robaxin.     FINAL CLINICAL IMPRESSION(S) / ED DIAGNOSES   Final diagnoses:  Fall, initial encounter     Rx / DC Orders   ED Discharge Orders          Ordered    meloxicam (MOBIC) 15 MG tablet  Daily        12/23/21 1610    methocarbamol (ROBAXIN) 500 MG tablet  Every 8 hours PRN        12/23/21 1610             Note:  This document was prepared using Dragon voice recognition software and may include unintentional dictation errors.   Pia Mau Vandiver, Cordelia Poche 12/23/21 1614    Sharman Cheek, MD 12/23/21 [phone]

## 2021-12-23 NOTE — ED Notes (Signed)
See triage note  presents with cont'd pain  s/p fall about 1 week ago  having pain to left knee ,hip and back  ambulates well to treatment room

## 2021-12-23 NOTE — Discharge Instructions (Signed)
You can take meloxicam and Robaxin as directed. Your x-ray showed no acute bony abnormality.

## 2021-12-23 NOTE — ED Triage Notes (Signed)
Pt states she slipped and fell a bout a week ago and is having lower back and left hip, knee pain since, pt is ambulatory with a steady gait

## 2022-08-24 ENCOUNTER — Other Ambulatory Visit: Payer: Self-pay

## 2022-08-24 ENCOUNTER — Emergency Department
Admission: EM | Admit: 2022-08-24 | Discharge: 2022-08-24 | Disposition: A | Discharge status: Home / Self Care | Payer: Medicaid Other | Attending: Emergency Medicine | Admitting: Emergency Medicine

## 2022-08-24 DIAGNOSIS — M47816 Spondylosis without myelopathy or radiculopathy, lumbar region: Secondary | ICD-10-CM

## 2022-08-24 DIAGNOSIS — M5442 Lumbago with sciatica, left side: Secondary | ICD-10-CM | POA: Diagnosis not present

## 2022-08-24 DIAGNOSIS — G8929 Other chronic pain: Secondary | ICD-10-CM

## 2022-08-24 DIAGNOSIS — M5431 Sciatica, right side: Secondary | ICD-10-CM

## 2022-08-24 DIAGNOSIS — M545 Low back pain, unspecified: Secondary | ICD-10-CM | POA: Diagnosis present

## 2022-08-24 DIAGNOSIS — M47896 Other spondylosis, lumbar region: Secondary | ICD-10-CM | POA: Insufficient documentation

## 2022-08-24 DIAGNOSIS — M5441 Lumbago with sciatica, right side: Secondary | ICD-10-CM | POA: Insufficient documentation

## 2022-08-24 MED ORDER — ORPHENADRINE CITRATE 30 MG/ML IJ SOLN
60.0000 mg | Freq: Once | INTRAMUSCULAR | Status: DC
  Filled 2022-08-24: qty 2

## 2022-08-24 MED ORDER — PREGABALIN 50 MG PO CAPS: 50.0000 mg | ORAL_CAPSULE | Freq: Three times a day (TID) | ORAL | 0 refills | Status: AC

## 2022-08-24 MED ORDER — PREDNISONE 10 MG PO TABS: ORAL_TABLET | ORAL | 0 refills | Status: DC

## 2022-08-24 MED ORDER — OXYCODONE-ACETAMINOPHEN 5-325 MG PO TABS
1.0000 | ORAL_TABLET | Freq: Once | ORAL | Status: AC
  Administered 2022-08-24: 1 via ORAL
  Filled 2022-08-24: qty 1

## 2022-08-24 MED ORDER — METHOCARBAMOL 500 MG PO TABS
500.0000 mg | ORAL_TABLET | Freq: Once | ORAL | Status: AC
  Administered 2022-08-24: 500 mg via ORAL
  Filled 2022-08-24: qty 1

## 2022-08-24 MED ORDER — DEXAMETHASONE SODIUM PHOSPHATE 10 MG/ML IJ SOLN
10.0000 mg | Freq: Once | INTRAMUSCULAR | Status: AC
  Administered 2022-08-24: 10 mg via INTRAMUSCULAR
  Filled 2022-08-24: qty 1

## 2022-08-24 MED ORDER — METHOCARBAMOL 500 MG PO TABS: 500.0000 mg | ORAL_TABLET | Freq: Three times a day (TID) | ORAL | 0 refills | Status: DC | PRN

## 2022-08-24 NOTE — ED Triage Notes (Signed)
Pt via POV c/o bilateral severe sciatica previously diagnosed and much worse over the past 2 weeks. Prior dx left side, now also affecting right with radiation down both hips and legs. Pain currently rated 9/10 bilaterally with sharp shooting pains into toes. No meds PTA.

## 2022-08-24 NOTE — ED Provider Notes (Signed)
[facility] EMERGENCY DEPARTMENT Provider Note   CSN: [id number] Arrival date & time: 08/24/22  1444     History  Chief Complaint  Patient presents with   Sciatica    Kara Ford is a 31 y.o. female.  With history of chronic back pain and bilateral sciatica presents for acute exacerbation of bilateral radiculopathy symptoms.  She describes burning pain in her left and right buttocks radiating down both anterior knees, anterior shins into her great toe bilaterally.  Left side is more severe than her right.  No loss of bowel or bladder symptoms.  She denies any recent falls trauma or injury.  No fevers or abdominal pain.  She has had the symptoms for years.  She has never seen orthopedic specialist, had an MRI or underwent physical therapy.  She has been on steroid tapers in the past as well as been on gabapentin with little relief only with the steroid tapers.  She denies any weakness in her lower legs.  Her pain is moderate to severe.  No urinary symptoms.  HPI     Home Medications Prior to Admission medications   Medication Sig Start Date End Date Taking? Authorizing Provider  predniSONE (DELTASONE) 10 MG tablet 10 day taper. 5,5,4,4,3,3,2,2,1,1 08/24/22  Yes Evon Slack, PA-C  pregabalin (LYRICA) 50 MG capsule Take 1 capsule (50 mg total) by mouth 3 (three) times daily. 08/24/22 08/24/23 Yes Evon Slack, PA-C  meloxicam (MOBIC) 15 MG tablet Take 1 tablet (15 mg total) by mouth daily. 12/23/21 12/23/22  Orvil Feil, PA-C      Allergies    Patient has no known allergies.    Review of Systems   Review of Systems  Physical Exam Updated Vital Signs BP (!) 137/93 (BP Location: Left Arm)   Pulse 88   Temp 97.8 F (36.6 C) (Oral)   Resp 16   Ht 5\' 7"  (1.702 m)   Wt 77.1 kg   SpO2 95%   BMI 26.63 kg/m  Physical Exam Constitutional:      Appearance: She is well-developed.  HENT:     Head: Normocephalic and atraumatic.  Eyes:      Conjunctiva/sclera: Conjunctivae normal.  Cardiovascular:     Rate and Rhythm: Normal rate.  Pulmonary:     Effort: Pulmonary effort is normal. No respiratory distress.  Musculoskeletal:        General: Normal range of motion.     Cervical back: Normal range of motion.     Comments: Lumbar Spine: Examination of the lumbar spine reveals no bony abnormality, no edema, and no ecchymosis.  There is no step off.  The patient has full range of motion of the lumbar spine with flexion and extension.  The patient has normal lateral bend and rotation.  The patient has no pain with range of motion activities.  The patient has a negative axial load test, and a negative rotational Waddell test.  The patient is non tender along the spinous process.  The patient is non tender along the paravertebral muscles, with no muscle spasms.  The patient is non tender along the iliac crest.  The patient is non tender in the sciatic notch.  The patient is non tender along the Sacroiliac joint.  There is no Coccyx joint tenderness.    Bilateral Lower Extremities: Examination of the lower extremities reveals no bony abnormality, no edema, and no ecchymosis.  The patient has full active and passive range of motion of the  hips, knees, and ankles.  There is no discomfort with range of motion exercises.  The patient is non tender along the greater trochanter region.  The patient has a negative Bevelyn Buckles' test bilaterally.  There is normal skin warmth.  There is normal capillary refill bilaterally.    Neurologic: The patient has a positive bilateral straight leg raise.  The patient has normal muscle strength testing for the quadriceps, calves, ankle dorsiflexion, ankle plantarflexion, and extensor hallicus longus.  The patient has sensation that is intact to light touch.  Patellar tendon reflexes are normal bilaterally.  No clonus noted bilaterally.   Skin:    General: Skin is warm.     Findings: No rash.  Neurological:     Mental  Status: She is alert and oriented to person, place, and time.  Psychiatric:        Behavior: Behavior normal.        Thought Content: Thought content normal.     ED Results / Procedures / Treatments   Labs (all labs ordered are listed, but only abnormal results are displayed) Labs Reviewed - No data to display  EKG None  Radiology No results found. X-rays of the lumbar spine reviewed by me today from 12/23/2021 show lower lumbar spondylosis with mild degenerative changes.  No abnormal bone lesions.  No spondylolisthesis.  Procedures Procedures   Medications Ordered in ED Medications  dexamethasone (DECADRON) injection 10 mg (has no administration in time range)  oxyCODONE-acetaminophen (PERCOCET/ROXICET) 5-325 MG per tablet 1 tablet (has no administration in time range)  orphenadrine (NORFLEX) injection 60 mg (has no administration in time range)    ED Course/ Medical Decision Making/ A&P                           Medical Decision Making Risk Prescription drug management.   31 year old female with acute on chronic low back pain with bilateral lumbar radiculopathy.  She has electrical pain radiating in both legs in a L5 nerve distribution.  She has no weakness or neurological deficits.  No loss of bowel or bladder symptoms.  In the ED patient was given oxycodone 5 mg, methocarbamol 500 mg p.o. and 10 mg of dexamethasone IM.  Vital signs are stable, afebrile.  Patient ambulatory.  Patient stable and ready for discharge home.  She was placed on Lyrica, prednisone taper, methocarbamol today in the emergency department.  We discussed symptoms that would need to be returning to the ER for.  I also discussed need for follow-up with orthopedic specialist. Final Clinical Impression(s) / ED Diagnoses Final diagnoses:  Bilateral sciatica  Lumbar spondylosis  Chronic midline low back pain with bilateral sciatica    Rx / DC Orders ED Discharge Orders          Ordered    predniSONE  (DELTASONE) 10 MG tablet        08/24/22 1634    pregabalin (LYRICA) 50 MG capsule  3 times daily        08/24/22 1634              Renata Caprice 08/24/22 1645    Merlyn Lot, MD 08/24/22 1749

## 2022-08-24 NOTE — Discharge Instructions (Signed)
Please take medications as prescribed.  You may use Tylenol for additional pain relief.  Call orthopedic office tomorrow to schedule a follow-up appointment.  Return to the ER for any worsening symptoms, weakness, loss of bowel or bladder symptoms or any urgent changes in your health

## 2023-08-12 ENCOUNTER — Emergency Department
Admission: EM | Admit: 2023-08-12 | Discharge: 2023-08-12 | Disposition: A | Discharge status: Home / Self Care | Payer: Medicaid Other | Attending: Emergency Medicine | Admitting: Emergency Medicine

## 2023-08-12 DIAGNOSIS — G894 Chronic pain syndrome: Secondary | ICD-10-CM | POA: Diagnosis not present

## 2023-08-12 DIAGNOSIS — M542 Cervicalgia: Secondary | ICD-10-CM | POA: Diagnosis present

## 2023-08-12 MED ORDER — PREDNISONE 10 MG (21) PO TBPK: ORAL_TABLET | ORAL | 0 refills | Status: DC

## 2023-08-12 MED ORDER — BACLOFEN 10 MG PO TABS: 10.0000 mg | ORAL_TABLET | Freq: Three times a day (TID) | ORAL | 0 refills | Status: AC

## 2023-08-12 NOTE — ED Triage Notes (Signed)
Patient is here with "sciatica" pain that began about 4 months ago; She states that she has been putting up with the pain but today she cannot tolerate the pain anymore; She ambulated with an upright steady gait

## 2023-08-12 NOTE — ED Provider Notes (Signed)
[facility] Provider Note    Event Date/Time   First MD Initiated Contact with Patient 08/12/23 1308     (approximate)   History   Abdominal Pain (Patient is here with "sciatica" pain that began about 4 months ago; She states that she has been putting up with the pain but today she cannot tolerate the pain anymore; She ambulated with an upright steady gait)   HPI  Kara Ford is a 33 y.o. female with no significant past medical history presents emergency department with sciatica type pain that began 4 months ago.  Patient states that her neck will hurt and then her left knee will hurt.  Her lower back will hurt and both legs will hurt.  Has not seen orthopedics or neurology as previously instructed.  No new symptoms or injuries.      Physical Exam   Triage Vital Signs: ED Triage Vitals  Encounter Vitals Group     BP 08/12/23 1247 114/70     Systolic BP Percentile --      Diastolic BP Percentile --      Pulse Rate 08/12/23 1247 93     Resp 08/12/23 1247 18     Temp 08/12/23 1247 98.3 F (36.8 C)     Temp src --      SpO2 08/12/23 1247 96 %     Weight 08/12/23 1248 185 lb (83.9 kg)     Height 08/12/23 1248 5' 7.5" (1.715 m)     Head Circumference --      Peak Flow --      Pain Score 08/12/23 1247 10     Pain Loc --      Pain Education --      Exclude from Growth Chart --     Most recent vital signs: Vitals:   08/12/23 1247  BP: 114/70  Pulse: 93  Resp: 18  Temp: 98.3 F (36.8 C)  SpO2: 96%     General: Awake, no distress.   CV:  Good peripheral perfusion. regular rate and  rhythm Resp:  Normal effort.  Abd:  No distention.   Other:  Cranial nerves II through XII grossly intact, 5/5 strength in lower and upper extremities, patient states pain is reproduced when I press on the posterior of her neck, states it radiates into her left knee.   ED Results / Procedures / Treatments   Labs (all labs ordered are listed, but only  abnormal results are displayed) Labs Reviewed  PREGNANCY, URINE  URINALYSIS, ROUTINE W REFLEX MICROSCOPIC     EKG     RADIOLOGY     PROCEDURES:   Procedures   MEDICATIONS ORDERED IN ED: Medications - No data to display   IMPRESSION / MDM / ASSESSMENT AND PLAN / ED COURSE  I reviewed the triage vital signs and the nursing notes.                              Differential diagnosis includes, but is not limited to, chronic pain, fibromyalgia, MS, radiculopathy  Patient's presentation is most consistent with acute illness / injury with system symptoms.   Due to the symptoms ongoing for 4 months for this this is a chronic problem.  Explained to patient that we will not be able to diagnose MS, fibromyalgia, or chronic sciatica here in the ED.  She is to follow-up with orthopedics and neurology.  She was given a  prescription for Sterapred and back fluid.  Return for worsening      FINAL CLINICAL IMPRESSION(S) / ED DIAGNOSES   Final diagnoses:  Chronic pain syndrome     Rx / DC Orders   ED Discharge Orders          Ordered    predniSONE (STERAPRED UNI-PAK 21 TAB) 10 MG (21) TBPK tablet        08/12/23 1322    baclofen (LIORESAL) 10 MG tablet  3 times daily        08/12/23 1322             Note:  This document was prepared using Dragon voice recognition software and may include unintentional dictation errors.    Faythe Ghee, PA-C 08/12/23 1327    Jene Every, MD 08/12/23 1336

## 2023-09-04 IMAGING — CR DG KNEE COMPLETE 4+V*L*
4 series · 4 of 4 positions shown · non-contrast
Comparison: None.

CLINICAL DATA: Fell 1 week ago, left knee pain

EXAM:
LEFT KNEE - COMPLETE 4+ VIEW

[knee ap]
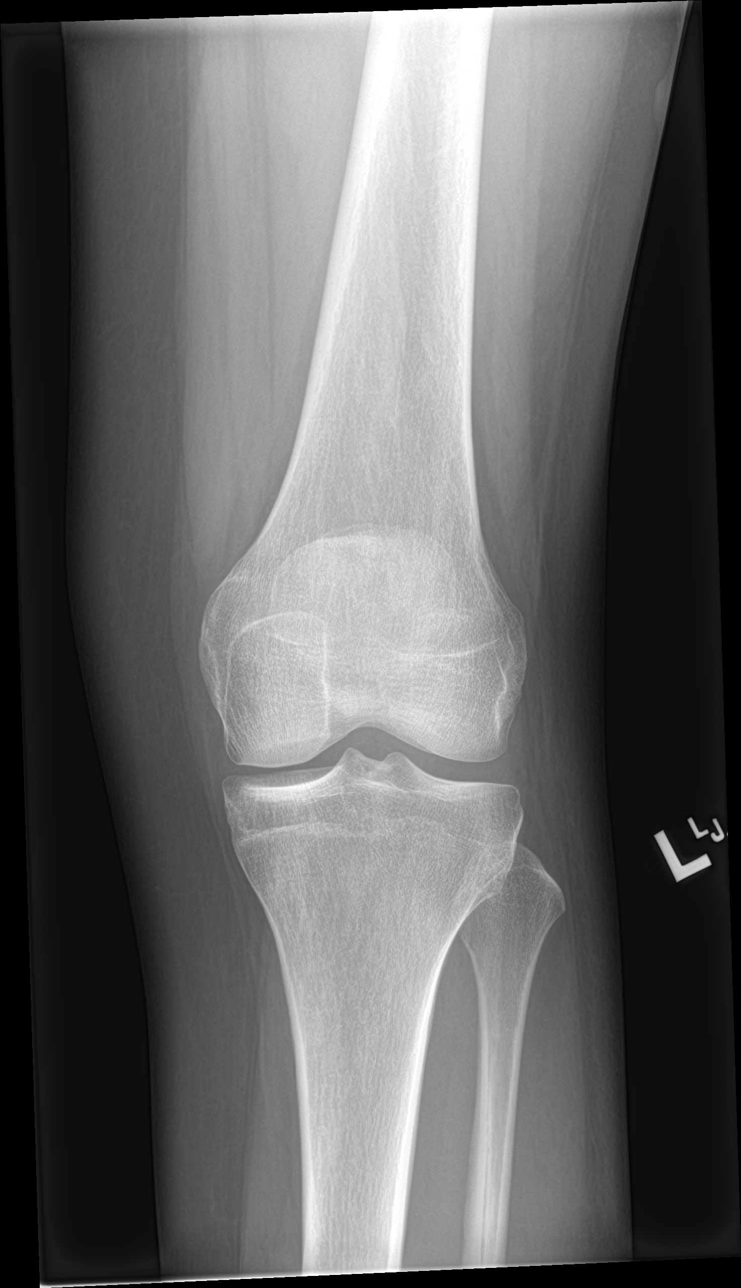

[knee obl (1 of 2)]
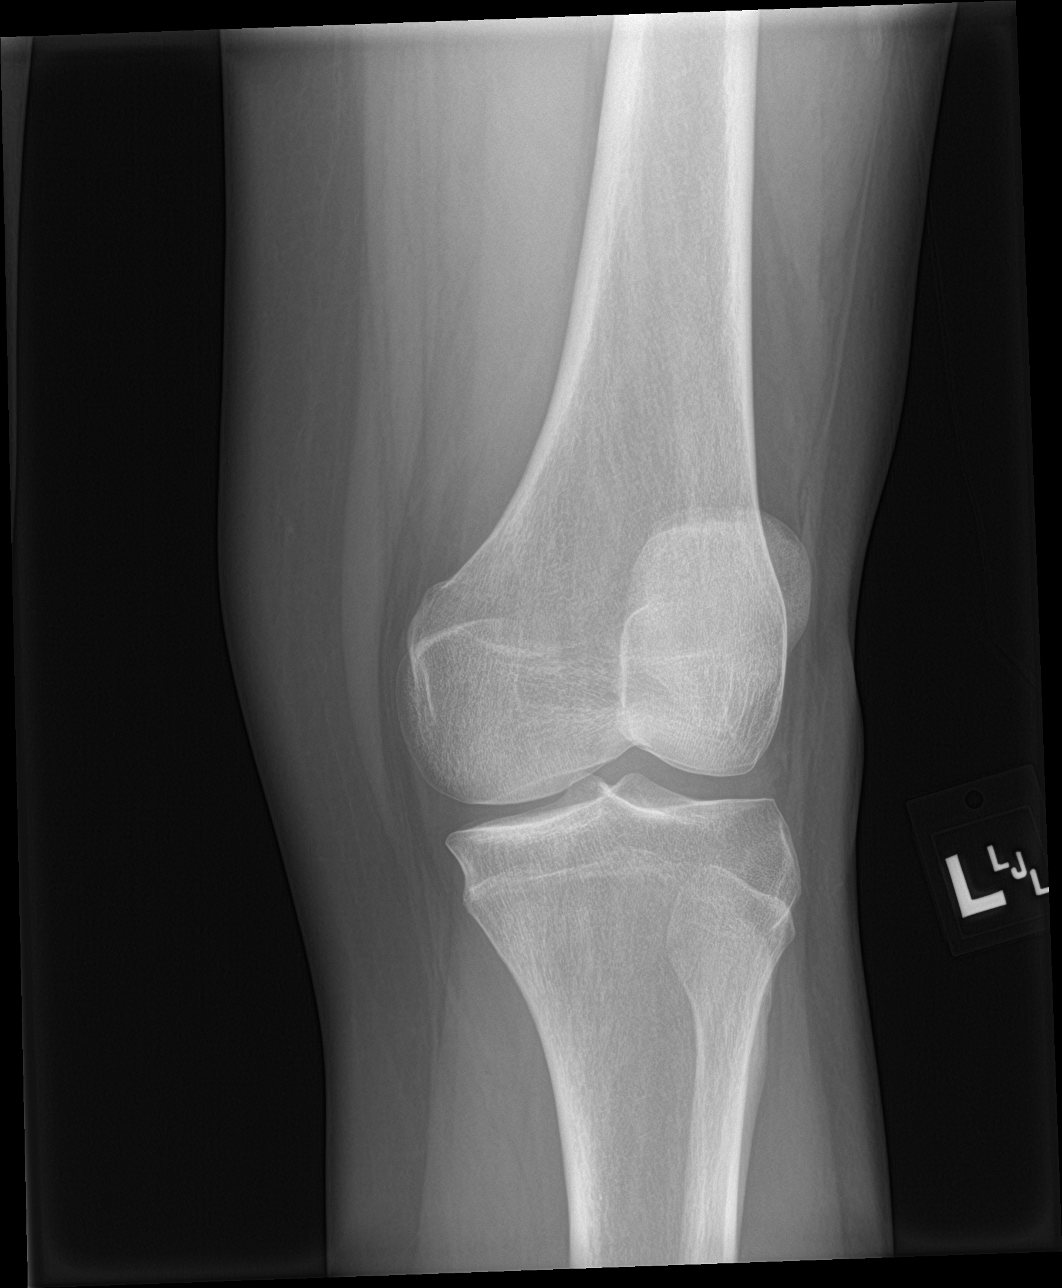

[knee obl (2 of 2)]
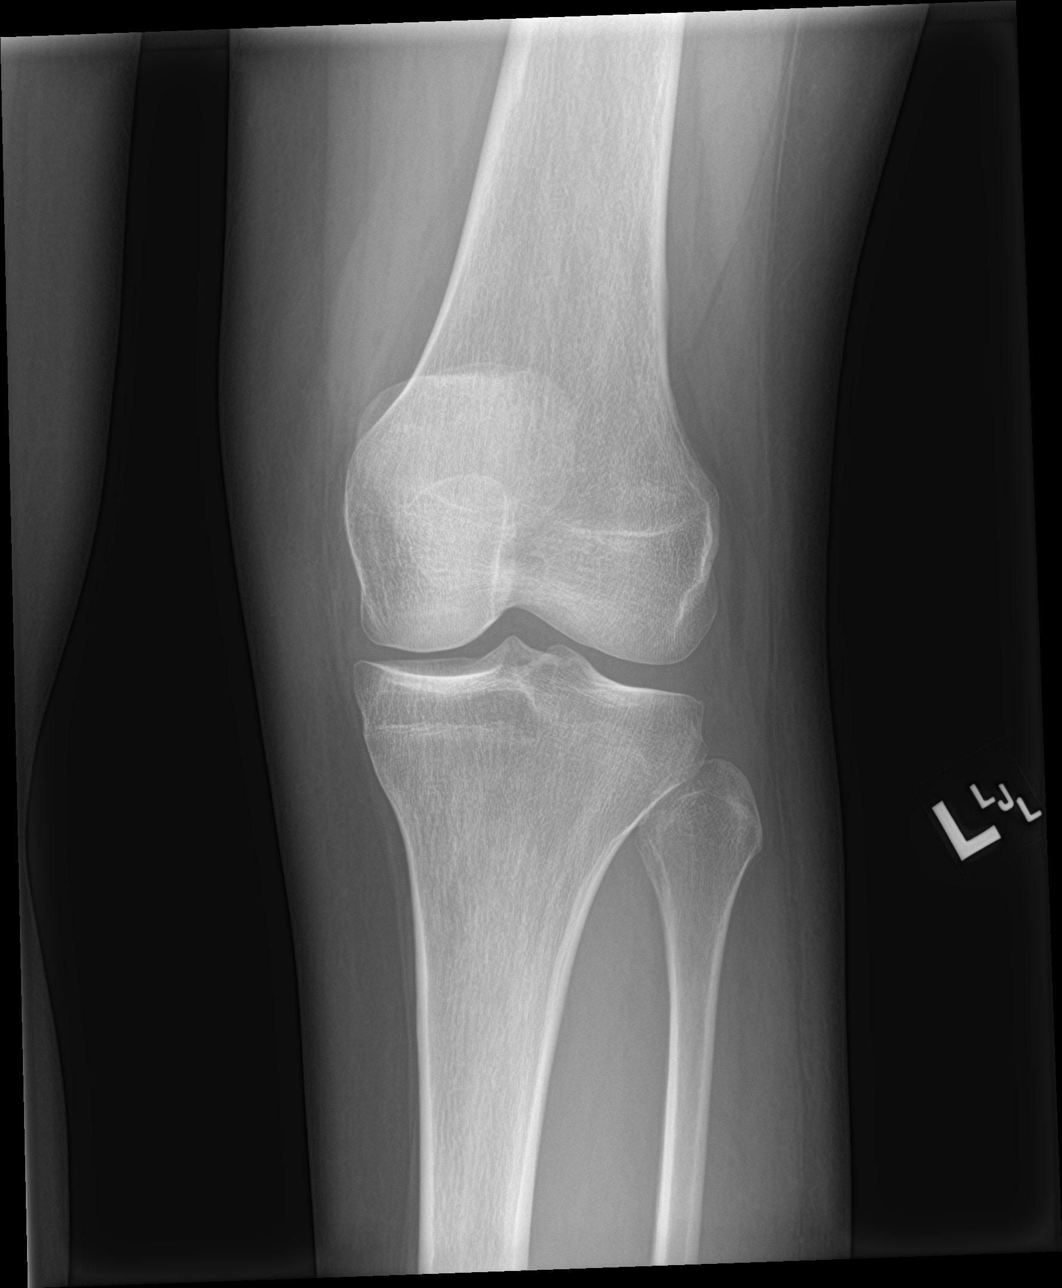

[knee lat]
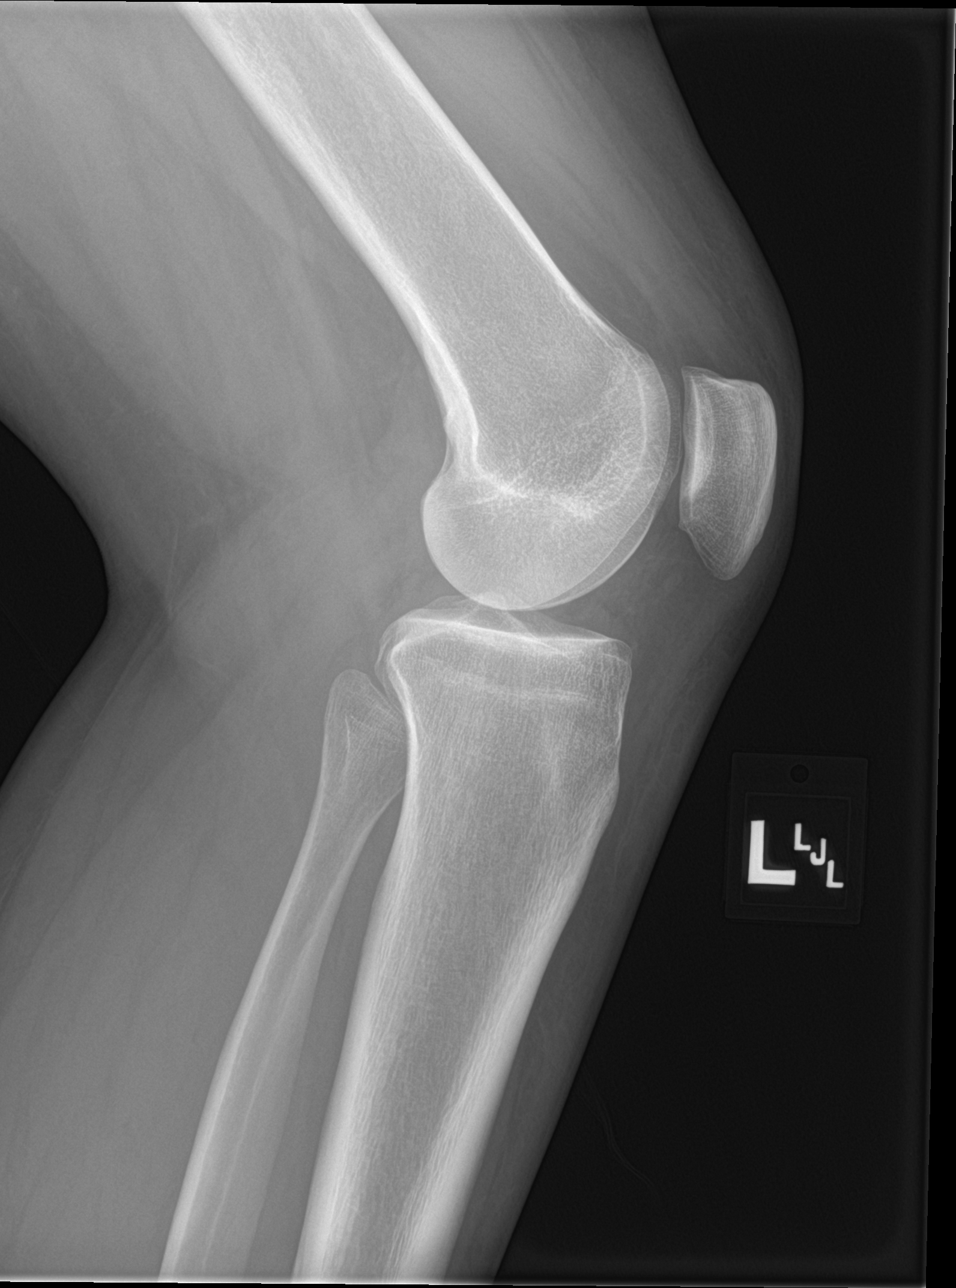

[4 of 4 positions shown; findings below may reference images not displayed]

FINDINGS: Frontal, bilateral oblique, lateral views of the left knee are
obtained. No fracture, subluxation, or dislocation. Joint spaces are
well preserved. No joint effusion. Soft tissues are unremarkable.
IMPRESSION: 1. Unremarkable left knee.

## 2023-09-04 IMAGING — CR DG LUMBAR SPINE 2-3V
3 series · 3 of 3 positions shown · non-contrast
Comparison: 07/01/2021

CLINICAL DATA: Fell 1 week ago, pain

EXAM:
LUMBAR SPINE - 2-3 VIEW

[l-spine ap]
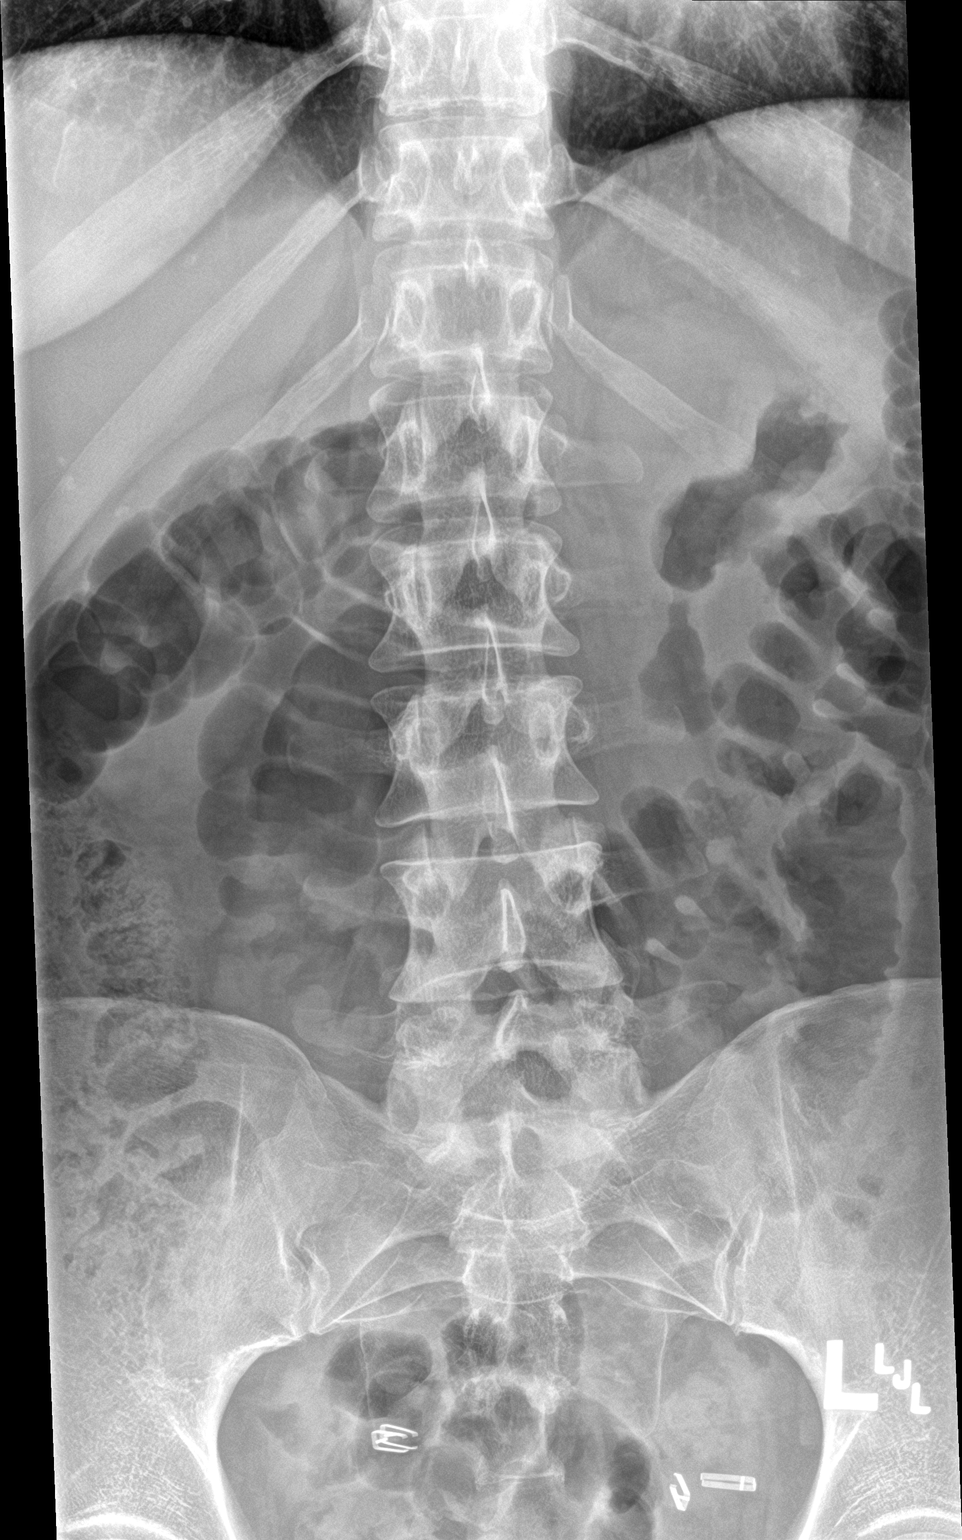

[l-spine lat]
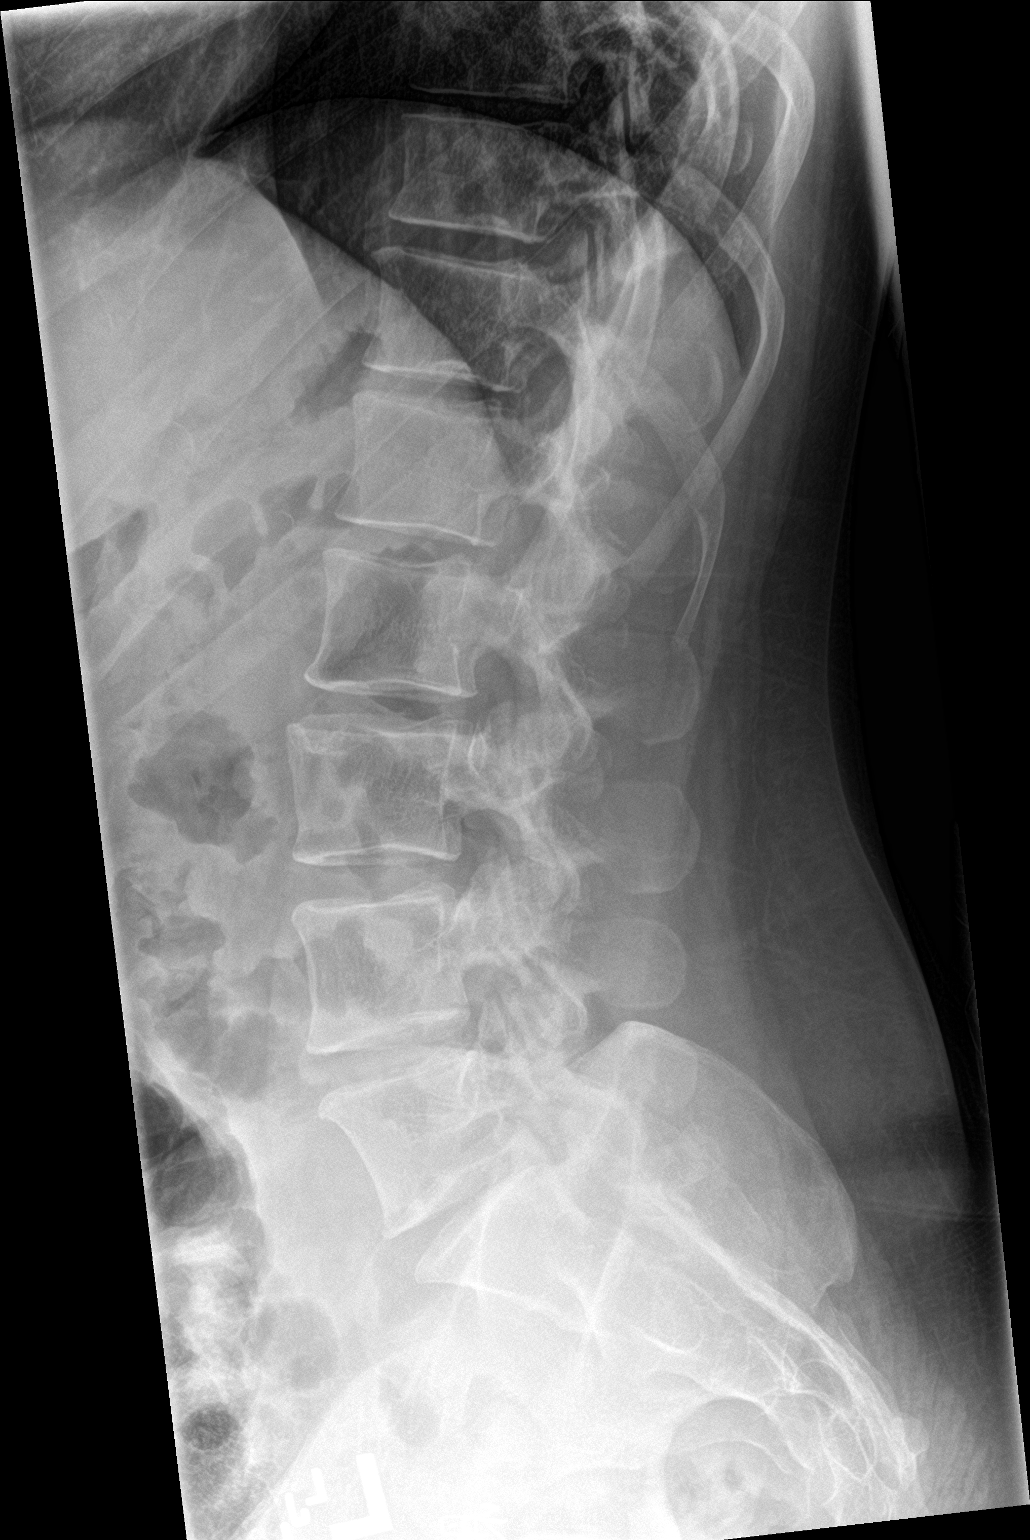

[l-spine spot]
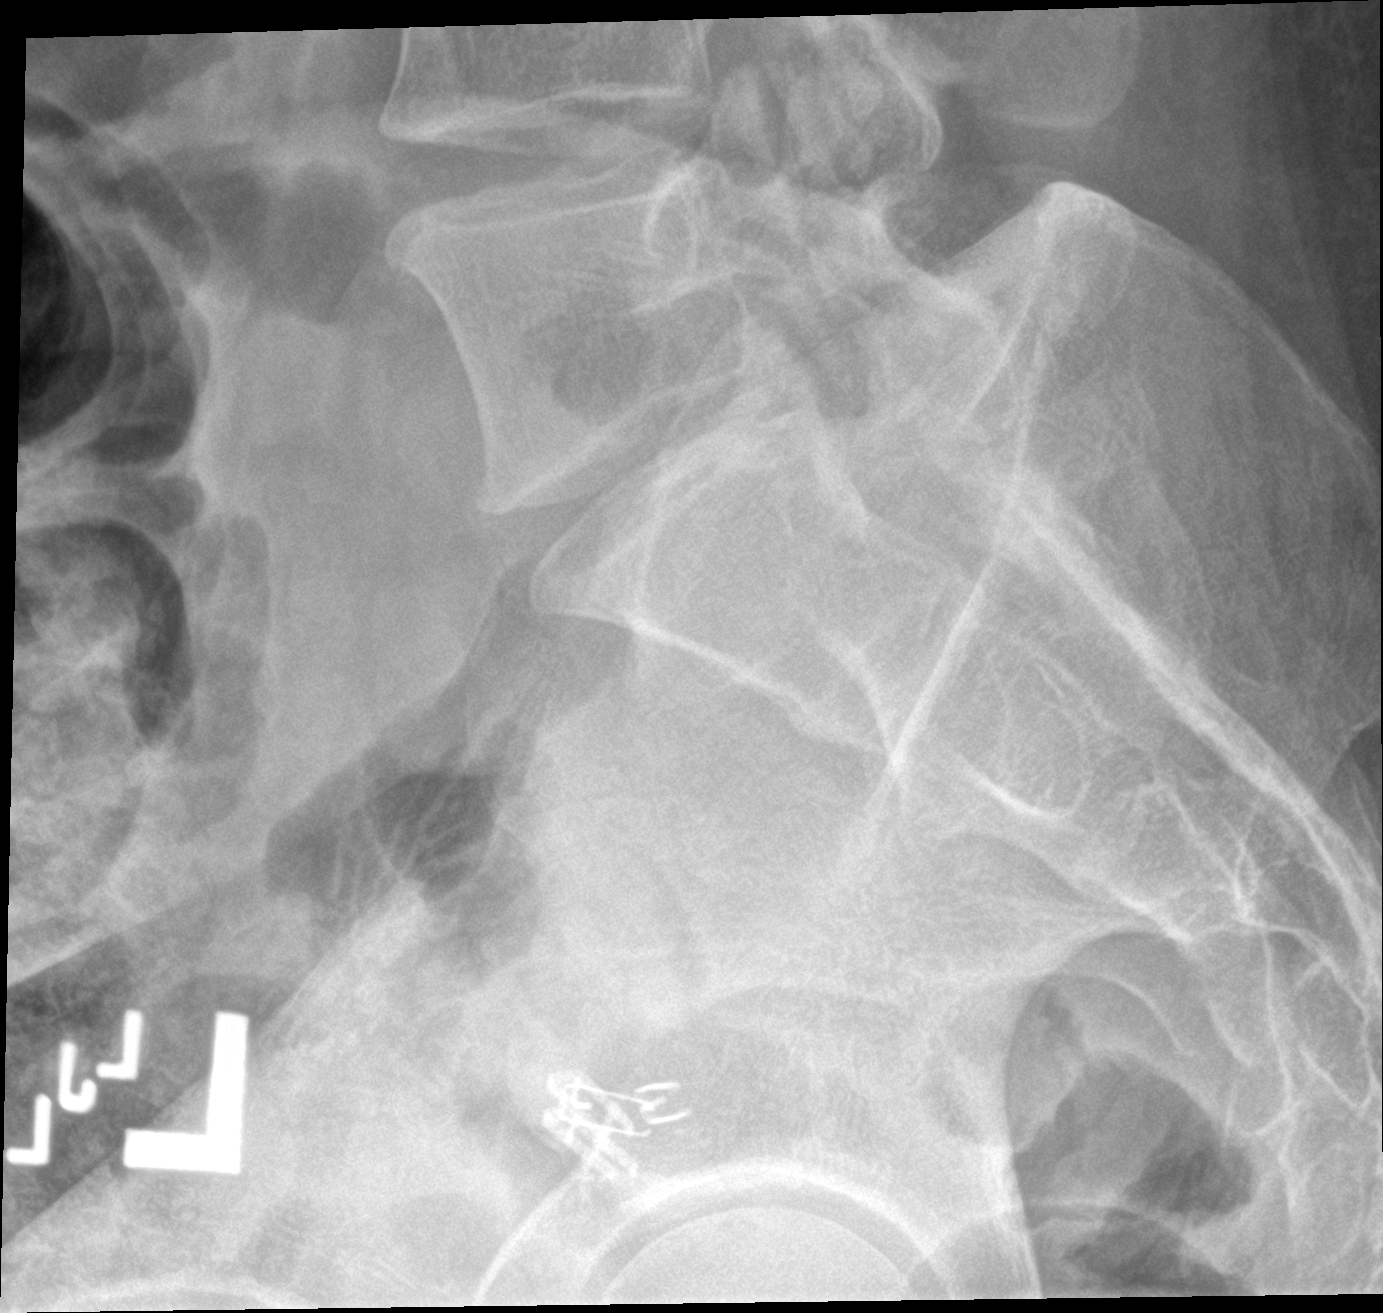

[3 of 3 positions shown; findings below may reference images not displayed]

FINDINGS: Frontal and lateral views of the lumbar spine are obtained. There
are 5 non-rib-bearing lumbar type vertebral bodies in normal
anatomic alignment. No acute fracture. Mild spondylosis at L2-3 and
L5-S1. Mild diffuse facet hypertrophy unchanged. Sacroiliac joints
are unremarkable.
IMPRESSION: 1. Stable mild multilevel spondylosis and facet hypertrophy. No
acute bony abnormality.

## 2023-09-27 ENCOUNTER — Emergency Department
Admission: EM | Admit: 2023-09-27 | Discharge: 2023-09-27 | Disposition: A | Discharge status: Home / Self Care | Payer: Medicaid Other | Attending: Emergency Medicine | Admitting: Emergency Medicine

## 2023-09-27 ENCOUNTER — Other Ambulatory Visit: Payer: Self-pay

## 2023-09-27 ENCOUNTER — Encounter: Payer: Self-pay | Admitting: Intensive Care

## 2023-09-27 DIAGNOSIS — Z23 Encounter for immunization: Secondary | ICD-10-CM | POA: Diagnosis not present

## 2023-09-27 DIAGNOSIS — S60812A Abrasion of left wrist, initial encounter: Secondary | ICD-10-CM | POA: Insufficient documentation

## 2023-09-27 DIAGNOSIS — Y990 Civilian activity done for income or pay: Secondary | ICD-10-CM | POA: Insufficient documentation

## 2023-09-27 DIAGNOSIS — S6992XA Unspecified injury of left wrist, hand and finger(s), initial encounter: Secondary | ICD-10-CM | POA: Diagnosis present

## 2023-09-27 DIAGNOSIS — W540XXA Bitten by dog, initial encounter: Secondary | ICD-10-CM | POA: Diagnosis not present

## 2023-09-27 HISTORY — DX: Sciatica, left side: M54.32

## 2023-09-27 HISTORY — DX: Sciatica, left side: M54.31

## 2023-09-27 MED ORDER — AMOXICILLIN-POT CLAVULANATE 875-125 MG PO TABS
1.0000 | ORAL_TABLET | Freq: Once | ORAL | Status: AC
  Administered 2023-09-27: 1 via ORAL
  Filled 2023-09-27: qty 1

## 2023-09-27 MED ORDER — TETANUS-DIPHTH-ACELL PERTUSSIS 5-2.5-18.5 LF-MCG/0.5 IM SUSY
0.5000 mL | PREFILLED_SYRINGE | Freq: Once | INTRAMUSCULAR | Status: AC
  Administered 2023-09-27: 0.5 mL via INTRAMUSCULAR
  Filled 2023-09-27: qty 0.5

## 2023-09-27 MED ORDER — AMOXICILLIN-POT CLAVULANATE 875-125 MG PO TABS: 1.0000 | ORAL_TABLET | Freq: Two times a day (BID) | ORAL | 0 refills | Status: AC

## 2023-09-27 NOTE — ED Triage Notes (Signed)
Patient reports a dog wrapping its mouth around her left wrist. Some marks noted on wrist. No bleeding. Reports she was working.

## 2023-09-27 NOTE — ED Provider Notes (Signed)
[facility] Provider Note    Event Date/Time   First MD Initiated Contact with Patient 09/27/23 [phone]     (approximate)   History   Animal Bite   HPI  Kara Ford is a 32 y.o. 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  Owner states dog is fully vaccinated.  Abrasions to the left wrist.  No other injuries.  Independent Historian contributed to assessment above: Family member at bedside corroborates information past medical history as above    Physical Exam   Triage Vital Signs: ED Triage Vitals [09/27/23 0921]  Encounter Vitals Group     BP (!) 115/98     Systolic BP Percentile      Diastolic BP Percentile      Pulse Rate (!) 108     Resp 18     Temp 98.3 F (36.8 C)     Temp Source Oral     SpO2 99 %     Weight 182 lb (82.6 kg)     Height 5' 7.5" (1.715 m)     Head Circumference      Peak Flow      Pain Score 5     Pain Loc      Pain Education      Exclude from Growth Chart     Most recent vital signs: Vitals:   09/27/23 0921  BP: (!) 115/98  Pulse: (!) 108  Resp: 18  Temp: 98.3 F (36.8 C)  SpO2: 99%    General: Awake, no distress.  CV:  Good peripheral perfusion.  Resp:  Normal effort.  Abd:  No distention.  Other:  Comfortable appearing, very small abrasions to the left wrist scattered.  Full range of motion.   ED Results / Procedures / Treatments   Labs (all labs ordered are listed, but only abnormal results are displayed) Labs Reviewed - No data to display  PROCEDURES:  Critical Care performed: No  Procedures   MEDICATIONS ORDERED IN ED: Medications  amoxicillin-clavulanate (AUGMENTIN) 875-125 MG per tablet 1 tablet (has no administration in time range)  Tdap (BOOSTRIX) injection 0.5 mL (has no administration in time range)     IMPRESSION / MDM / ASSESSMENT AND PLAN / ED COURSE  I reviewed the  triage vital signs and the nursing notes.                                Patient's presentation is most consistent with acute presentation with potential threat to life or bodily function.  Differential diagnosis includes, but is not limited to, rabies exposure, laceration, infection, neurovascular or tendon injury  The patient is on the cardiac monitor to evaluate for evidence of arrhythmia and/or significant heart rate changes.  MDM:    Able to fully range, sensate, doubt neurovascular or tendon injury.  Small abrasions dog bite will give antibiotic prophylaxis.  Unknown last tetanus so we will give tetanus today.  Thoroughly cleansed with soap and water.  Fully vaccinated dog, will confirm with owner, defer rabies. DC        FINAL CLINICAL IMPRESSION(S) / ED DIAGNOSES   Final diagnoses:  Dog bite, initial encounter     Rx / DC Orders   ED Discharge Orders          Ordered  amoxicillin-clavulanate (AUGMENTIN) 875-125 MG tablet  2 times daily        09/27/23 0949             Note:  This document was prepared using Dragon voice recognition software and may include unintentional dictation errors.    Pilar Jarvis, MD 09/27/23 [phone]

## 2023-09-27 NOTE — ED Notes (Signed)
This RN to bedside to call animal control, pt states she does not know where she was when she was bit. She was working as a Product manager. Pt. States she will call animal control once she finds the address she was at.

## 2023-09-27 NOTE — ED Notes (Signed)
Pt. To ED for dog bite to left wrist. Pt. States dogs shots up to date.

## 2023-09-27 NOTE — Discharge Instructions (Signed)
Please keep your wound clean by washing at least daily with soap and water. If you see any signs of infection like spreading redness, pus coming from the wound, extreme pain, fevers, chills or any other worsening doctor right away or come back to the emergency department  Take Antibiotics for the full course as prescribed.  Thank you for choosing [place] for your health care today!  Please see your primary doctor this week for a follow up appointment.   If you have any new, worsening, or unexpected symptoms call your doctor right away or come back to the emergency department for reevaluation.  It was my pleasure to care for you today.   Daneil Dan Modesto Charon, MD

## 2023-10-08 ENCOUNTER — Other Ambulatory Visit: Payer: Self-pay

## 2023-10-08 ENCOUNTER — Emergency Department: Payer: Medicaid Other

## 2023-10-08 ENCOUNTER — Emergency Department
Admission: EM | Admit: 2023-10-08 | Discharge: 2023-10-08 | Disposition: A | Discharge status: Home / Self Care | Payer: Medicaid Other | Attending: Emergency Medicine | Admitting: Emergency Medicine

## 2023-10-08 DIAGNOSIS — J40 Bronchitis, not specified as acute or chronic: Secondary | ICD-10-CM | POA: Insufficient documentation

## 2023-10-08 DIAGNOSIS — Z20822 Contact with and (suspected) exposure to covid-19: Secondary | ICD-10-CM | POA: Insufficient documentation

## 2023-10-08 DIAGNOSIS — R059 Cough, unspecified: Secondary | ICD-10-CM | POA: Diagnosis present

## 2023-10-08 LAB — BASIC METABOLIC PANEL
Anion gap: 7 (ref 5–15)
BUN: 7 mg/dL (ref 6–20)
CO2: 23 mmol/L (ref 22–32)
Calcium: 8.9 mg/dL (ref 8.9–10.3)
Chloride: 108 mmol/L (ref 98–111)
Creatinine, Ser: 0.54 mg/dL (ref 0.44–1.00)
GFR, Estimated: >60 mL/min (ref >60)
Glucose, Bld: 89 mg/dL (ref 70–99)
Potassium: 4 mmol/L (ref 3.5–5.1)
Sodium: 138 mmol/L (ref 135–145)

## 2023-10-08 LAB — RESP PANEL BY RT-PCR (RSV, FLU A&B, COVID)  RVPGX2
Influenza A by PCR: NEGATIVE
Influenza B by PCR: NEGATIVE
Resp Syncytial Virus by PCR: NEGATIVE
SARS Coronavirus 2 by RT PCR: NEGATIVE

## 2023-10-08 LAB — CBC
HCT: 43.3 % (ref 36.0–46.0)
Hemoglobin: 14.5 g/dL (ref 12.0–15.0)
MCH: 34.4 pg — ABNORMAL HIGH (ref 26.0–34.0)
MCHC: 33.5 g/dL (ref 30.0–36.0)
MCV: 102.6 fL — ABNORMAL HIGH (ref 80.0–100.0)
Platelets: 268 10*3/uL (ref 150–400)
RBC: 4.22 MIL/uL (ref 3.87–5.11)
RDW: 12.2 % (ref 11.5–15.5)
WBC: 6.5 10*3/uL (ref 4.0–10.5)
nRBC: 0 % (ref 0.0–0.2)

## 2023-10-08 LAB — TROPONIN I (HIGH SENSITIVITY): Troponin I (High Sensitivity): <2 ng/L (ref <18)

## 2023-10-08 LAB — HCG, QUANTITATIVE, PREGNANCY: hCG, Beta Chain, Quant, S: <1 m[IU]/mL (ref <5)

## 2023-10-08 MED ORDER — IPRATROPIUM-ALBUTEROL 0.5-2.5 (3) MG/3ML IN SOLN
3.0000 mL | Freq: Once | RESPIRATORY_TRACT | Status: AC
  Administered 2023-10-08: 3 mL via RESPIRATORY_TRACT
  Filled 2023-10-08: qty 3

## 2023-10-08 MED ORDER — PREDNISONE 20 MG PO TABS
60.0000 mg | ORAL_TABLET | Freq: Once | ORAL | Status: AC
  Administered 2023-10-08: 60 mg via ORAL
  Filled 2023-10-08: qty 3

## 2023-10-08 MED ORDER — KETOROLAC TROMETHAMINE 30 MG/ML IJ SOLN
30.0000 mg | Freq: Once | INTRAMUSCULAR | Status: AC
  Administered 2023-10-08: 30 mg via INTRAMUSCULAR
  Filled 2023-10-08: qty 1

## 2023-10-08 MED ORDER — ALBUTEROL SULFATE HFA 108 (90 BASE) MCG/ACT IN AERS: 2.0000 | INHALATION_SPRAY | Freq: Four times a day (QID) | RESPIRATORY_TRACT | 0 refills | Status: AC | PRN

## 2023-10-08 MED ORDER — PREDNISONE 20 MG PO TABS: 60.0000 mg | ORAL_TABLET | Freq: Every day | ORAL | 0 refills | Status: AC

## 2023-10-08 NOTE — ED Triage Notes (Addendum)
Pt presents with productive cough and ShOB x 4 days. Pt reports associated CP. Pt had a sinus infection a week before the cough started.

## 2023-10-08 NOTE — ED Provider Notes (Signed)
[facility] Provider Note    Event Date/Time   First MD Initiated Contact with Patient 10/08/23 1508     (approximate)   History   Chief Complaint Cough   HPI  Kara Ford is a 32 y.o. female with no significant past medical history who presents to the ED complaining of cough.  Patient reports that she has had 4 days of cough productive of thick yellow sputum.  She denies any fevers but has been feeling short of breath at times, also reports sharp pain in her chest when she goes to cough.  She has not noticed any pain or swelling in her legs, denies any history of asthma or COPD.  She is not aware of any sick contacts, denies any nausea, vomiting, or diarrhea.     Physical Exam   Triage Vital Signs: ED Triage Vitals  Encounter Vitals Group     BP 10/08/23 1424 135/80     Systolic BP Percentile --      Diastolic BP Percentile --      Pulse Rate 10/08/23 1424 99     Resp 10/08/23 1424 16     Temp 10/08/23 1424 98.8 F (37.1 C)     Temp Source 10/08/23 1424 Oral     SpO2 10/08/23 1424 96 %     Weight 10/08/23 1423 185 lb (83.9 kg)     Height 10/08/23 1423 5\' 7"  (1.702 m)     Head Circumference --      Peak Flow --      Pain Score 10/08/23 1429 6     Pain Loc --      Pain Education --      Exclude from Growth Chart --     Most recent vital signs: Vitals:   10/08/23 1424  BP: 135/80  Pulse: 99  Resp: 16  Temp: 98.8 F (37.1 C)  SpO2: 96%    Constitutional: Alert and oriented. Eyes: Conjunctivae are normal. Head: Atraumatic. Nose: No congestion/rhinnorhea. Mouth/Throat: Mucous membranes are moist.  Cardiovascular: Normal rate, regular rhythm. Grossly normal heart sounds.  2+ radial pulses bilaterally.  Left-sided chest wall tenderness to palpation. Respiratory: Normal respiratory effort.  No retractions. Lungs CTAB. Gastrointestinal: Soft and nontender. No distention. Musculoskeletal: No lower extremity tenderness nor edema.   Neurologic:  Normal speech and language. No gross focal neurologic deficits are appreciated.    ED Results / Procedures / Treatments   Labs (all labs ordered are listed, but only abnormal results are displayed) Labs Reviewed  CBC - Abnormal; Notable for the following components:      Result Value   MCV 102.6 (*)    MCH 34.4 (*)    All other components within normal limits  RESP PANEL BY RT-PCR (RSV, FLU A&B, COVID)  [id number]  BASIC METABOLIC PANEL  HCG, QUANTITATIVE, PREGNANCY  POC URINE PREG, ED  TROPONIN I (HIGH SENSITIVITY)     EKG  ED ECG REPORT I, Chesley Noon, the attending physician, personally viewed and interpreted this ECG.   Date: 10/08/2023  EKG Time: 14:28  Rate: 81  Rhythm: normal sinus rhythm  Axis: Normal  Intervals:none  ST&T Change: None  RADIOLOGY Chest x-ray reviewed and interpreted by me with no infiltrate, edema, or effusion.  PROCEDURES:  Critical Care performed: No  Procedures   MEDICATIONS ORDERED IN ED: Medications  ipratropium-albuterol (DUONEB) 0.5-2.5 (3) MG/3ML nebulizer solution 3 mL (3 mLs Nebulization Given 10/08/23 1537)  predniSONE (DELTASONE) tablet 60 mg (60  mg Oral Given 10/08/23 1535)  ketorolac (TORADOL) 30 MG/ML injection 30 mg (30 mg Intramuscular Given 10/08/23 1535)     IMPRESSION / MDM / ASSESSMENT AND PLAN / ED COURSE  I reviewed the triage vital signs and the nursing notes.                              32 y.o. female with no significant past medical history presents to the ED with 4 days of productive cough with mild difficulty breathing and pain in her chest.  Patient's presentation is most consistent with acute presentation with potential threat to life or bodily function.  Differential diagnosis includes, but is not limited to, ACS, PE, pneumonia, pneumothorax, musculoskeletal pain, bronchitis, COVID-19, influenza, anemia, electrolyte abnormality.  Patient nontoxic-appearing and in no acute distress,  vital signs are unremarkable.  She is not in any respiratory distress and lungs are clear to auscultation bilaterally, but given significant cough we will trial prednisone and DuoNeb.  Chest x-ray is unremarkable, EKG shows no evidence of arrhythmia or ischemia and troponin within normal limits.  I doubt ACS or PE given reproducible pain with palpation.  Labs are reassuring with no significant anemia, leukocytosis, electrolyte abnormality, or AKI.  Symptoms improved following DuoNeb and prednisone, patient appropriate for discharge home with outpatient follow-up.  She was counseled to return to the ED for new or worsening symptoms, patient agrees with plan.      FINAL CLINICAL IMPRESSION(S) / ED DIAGNOSES   Final diagnoses:  Bronchitis     Rx / DC Orders   ED Discharge Orders          Ordered    albuterol (VENTOLIN HFA) 108 (90 Base) MCG/ACT inhaler  Every 6 hours PRN       Note to Pharmacy: Please supply with spacer   10/08/23 1558    predniSONE (DELTASONE) 20 MG tablet  Daily with breakfast        10/08/23 1558             Note:  This document was prepared using Dragon voice recognition software and may include unintentional dictation errors.   Chesley Noon, MD 10/08/23 [phone]

## 2023-10-08 NOTE — ED Notes (Signed)
See triage notes. Patient c/o productive cough and some shortness of breath times four days.

## 2024-01-26 ENCOUNTER — Other Ambulatory Visit: Payer: Self-pay

## 2024-01-26 ENCOUNTER — Encounter: Payer: Self-pay | Admitting: Emergency Medicine

## 2024-01-26 ENCOUNTER — Emergency Department
Admission: EM | Admit: 2024-01-26 | Discharge: 2024-01-26 | Disposition: A | Discharge status: Home / Self Care | Attending: Emergency Medicine | Admitting: Emergency Medicine

## 2024-01-26 DIAGNOSIS — H9203 Otalgia, bilateral: Secondary | ICD-10-CM | POA: Insufficient documentation

## 2024-01-26 DIAGNOSIS — J020 Streptococcal pharyngitis: Secondary | ICD-10-CM | POA: Diagnosis not present

## 2024-01-26 DIAGNOSIS — J029 Acute pharyngitis, unspecified: Secondary | ICD-10-CM | POA: Diagnosis present

## 2024-01-26 LAB — GROUP A STREP BY PCR: Group A Strep by PCR: NOT DETECTED

## 2024-01-26 LAB — RESP PANEL BY RT-PCR (RSV, FLU A&B, COVID)  RVPGX2
Influenza A by PCR: NEGATIVE
Influenza B by PCR: NEGATIVE
Resp Syncytial Virus by PCR: NEGATIVE
SARS Coronavirus 2 by RT PCR: NEGATIVE

## 2024-01-26 MED ORDER — LIDOCAINE VISCOUS HCL 2 % MT SOLN
15.0000 mL | Freq: Once | OROMUCOSAL | Status: AC
  Administered 2024-01-26: 15 mL via OROMUCOSAL
  Filled 2024-01-26: qty 15

## 2024-01-26 MED ORDER — PSEUDOEPHEDRINE HCL 60 MG PO TABS: 60.0000 mg | ORAL_TABLET | ORAL | 0 refills | Status: AC | PRN

## 2024-01-26 MED ORDER — PENICILLIN G BENZATHINE 1200000 UNIT/2ML IM SUSY
1.2000 10*6.[IU] | PREFILLED_SYRINGE | Freq: Once | INTRAMUSCULAR | Status: AC
  Administered 2024-01-26: 1.2 10*6.[IU] via INTRAMUSCULAR
  Filled 2024-01-26: qty 2

## 2024-01-26 NOTE — ED Triage Notes (Signed)
 Patient to ED via POV for sore throat and bilateral ear pain. Ongoing x2 days.

## 2024-01-26 NOTE — Discharge Instructions (Signed)
 Your evaluated in the ED for sore throat and ear pain.  Your rapid strep test is negative and your respiratory panel which includes COVID, influenza and RSV is negative as well.  Your physical exam is consistent with strep pharyngitis and given your history of recurrent strep we have decided to administer a penicillin antibiotic in the ED. please follow-up with your primary care provider as needed.

## 2024-01-26 NOTE — ED Provider Notes (Signed)
 [facility] Emergency Department Provider Note     Event Date/Time   First MD Initiated Contact with Patient 01/26/24 1314     (approximate)   History   Sore Throat   HPI  Kara Ford is a 33 y.o. female with no significant past medical history sore throat and bilateral ear pain x 48 hours. Hx of recurrent strep throat.  Patient reports pain with swallowing.  Denies fever and chills.    Physical Exam   Triage Vital Signs: ED Triage Vitals  Encounter Vitals Group     BP 01/26/24 1301 136/66     Systolic BP Percentile --      Diastolic BP Percentile --      Pulse Rate 01/26/24 1301 79     Resp 01/26/24 1301 18     Temp 01/26/24 1301 98.5 F (36.9 C)     Temp Source 01/26/24 1301 Oral     SpO2 01/26/24 1301 95 %     Weight 01/26/24 1258 185 lb (83.9 kg)     Height 01/26/24 1258 5\' 7"  (1.702 m)     Head Circumference --      Peak Flow --      Pain Score 01/26/24 1258 8     Pain Loc --      Pain Education --      Exclude from Growth Chart --     Most recent vital signs: Vitals:   01/26/24 1301 01/26/24 1514  BP: 136/66   Pulse: 79 77  Resp: 18 17  Temp: 98.5 F (36.9 C)   SpO2: 95% 99%    General: Well appearing. Alert and oriented. INAD.   Head:  NCAT.  Ears:  EACs patent. Tympanic membranes clear bilaterally. No bulging, erythema or discharge. Mild clear fluid behind right TM. Throat: Oropharynx clear. mild erythema and right side exudates. Tonsils not enlarged. Uvula is midline. CV:  Good peripheral perfusion.  RESP:  Normal effort.  ED Results / Procedures / Treatments   Labs (all labs ordered are listed, but only abnormal results are displayed) Labs Reviewed  GROUP A STREP BY PCR  RESP PANEL BY RT-PCR (RSV, FLU A&B, COVID)  RVPGX2   No results found.  PROCEDURES:  Critical Care performed: No  Procedures   MEDICATIONS ORDERED IN ED: Medications  penicillin g benzathine (BICILLIN LA) 1200000 UNIT/2ML  injection 1.2 Million Units (1.2 Million Units Intramuscular Given 01/26/24 1450)  lidocaine (XYLOCAINE) 2 % viscous mouth solution 15 mL (15 mLs Mouth/Throat Given 01/26/24 1450)     IMPRESSION / MDM / ASSESSMENT AND PLAN / ED COURSE  I reviewed the triage vital signs and the nursing notes.                               33 y.o. female presents to the emergency department for evaluation and treatment of acute sore throat and ear pain. See HPI for further details.   Differential diagnosis includes, but is not limited to viral URI, strep pharyngitis, viral pharyngitis, otitis media  Patient's presentation is most consistent with acute complicated illness / injury requiring diagnostic workup.  Patient is alert and oriented.  She is hemodynamically stable and afebrile.  Physical exam findings are pertinent for mild erythema of the oropharynx and exudates on the right tonsil.  Respiratory panel is reassuring.  Rapid strep test is negative however given clinical presentation and patient's history we will  administer prophylactic penicillin injection.  ED pain management with lidocaine viscous.  Prescribe Sudafed for decongestion of behind right ear.  Patient is in stable condition for discharge home.  She is advised to follow-up with her primary care as needed.  ED return precautions discussed.   FINAL CLINICAL IMPRESSION(S) / ED DIAGNOSES   Final diagnoses:  Strep pharyngitis  Acute ear pain, bilateral   Rx / DC Orders   ED Discharge Orders          Ordered    pseudoephedrine (SUDAFED) 60 MG tablet  Every 4 hours PRN        01/26/24 1436             Note:  This document was prepared using Dragon voice recognition software and may include unintentional dictation errors.    Romeo Apple, Shirell Struthers A, PA-C 01/26/24 1518    Delton Prairie, MD 01/27/24 [phone]

## 2024-08-20 ENCOUNTER — Other Ambulatory Visit: Payer: Self-pay

## 2024-08-20 ENCOUNTER — Emergency Department
Admission: EM | Admit: 2024-08-20 | Discharge: 2024-08-20 | Disposition: A | Discharge status: Home / Self Care | Attending: Emergency Medicine | Admitting: Emergency Medicine

## 2024-08-20 DIAGNOSIS — R21 Rash and other nonspecific skin eruption: Secondary | ICD-10-CM | POA: Diagnosis present

## 2024-08-20 DIAGNOSIS — B029 Zoster without complications: Secondary | ICD-10-CM | POA: Insufficient documentation

## 2024-08-20 DIAGNOSIS — R059 Cough, unspecified: Secondary | ICD-10-CM | POA: Insufficient documentation

## 2024-08-20 LAB — RESP PANEL BY RT-PCR (RSV, FLU A&B, COVID)  RVPGX2
Influenza A by PCR: NEGATIVE
Influenza B by PCR: NEGATIVE
Resp Syncytial Virus by PCR: NEGATIVE
SARS Coronavirus 2 by RT PCR: NEGATIVE

## 2024-08-20 MED ORDER — ONDANSETRON 4 MG PO TBDP
4.0000 mg | ORAL_TABLET | Freq: Once | ORAL | Status: AC
Start: 2024-08-20 — End: 2024-08-20
  Administered 2024-08-20: 4 mg via ORAL
  Filled 2024-08-20: qty 1

## 2024-08-20 MED ORDER — LIDOCAINE 5 % EX PTCH
1.0000 | MEDICATED_PATCH | CUTANEOUS | Status: DC
Start: 2024-08-20 — End: 2024-08-21
  Administered 2024-08-20: 1 via TRANSDERMAL
  Filled 2024-08-20: qty 1

## 2024-08-20 MED ORDER — FAMOTIDINE 20 MG PO TABS
20.0000 mg | ORAL_TABLET | Freq: Once | ORAL | Status: AC
  Administered 2024-08-20: 20 mg via ORAL
  Filled 2024-08-20: qty 1

## 2024-08-20 MED ORDER — IBUPROFEN 600 MG PO TABS: 600.0000 mg | ORAL_TABLET | Freq: Three times a day (TID) | ORAL | 0 refills | Status: AC | PRN

## 2024-08-20 MED ORDER — IBUPROFEN 600 MG PO TABS
600.0000 mg | ORAL_TABLET | Freq: Once | ORAL | Status: AC
  Administered 2024-08-20: 600 mg via ORAL
  Filled 2024-08-20: qty 1

## 2024-08-20 MED ORDER — ZIKS ARTHRITIS PAIN RELIEF 0.025-1-12 % EX CREA: 1.0000 | TOPICAL_CREAM | Freq: Four times a day (QID) | CUTANEOUS | 0 refills | Status: DC | PRN

## 2024-08-20 MED ORDER — VALACYCLOVIR HCL 500 MG PO TABS
1000.0000 mg | ORAL_TABLET | Freq: Three times a day (TID) | ORAL | Status: AC
  Administered 2024-08-20: 1000 mg via ORAL
  Filled 2024-08-20: qty 2

## 2024-08-20 MED ORDER — LIDOCAINE 5 % EX PTCH
1.0000 | MEDICATED_PATCH | CUTANEOUS | 0 refills | Status: AC
Start: 2024-08-20 — End: 2024-08-30

## 2024-08-20 MED ORDER — ZIKS ARTHRITIS PAIN RELIEF 0.025-1-12 % EX CREA: 1.0000 | TOPICAL_CREAM | Freq: Four times a day (QID) | CUTANEOUS | 0 refills | Status: AC | PRN

## 2024-08-20 MED ORDER — VALACYCLOVIR HCL 500 MG PO TABS: 1000.0000 mg | ORAL_TABLET | Freq: Two times a day (BID) | ORAL | 0 refills | Status: AC

## 2024-08-20 NOTE — Discharge Instructions (Addendum)
 You have been diagnosed with shingles.  Please take Aciclovir 2 tablets by mouth every 12 hours for 7 days.  You can apply capsaicin cream every 6 hours for itchiness.  Please take ibuprofen 1 tablet 600 mg every 8 hours after main meals for pain.  Please come back to ED or go to your PCP if you have new symptoms or symptoms worsen

## 2024-08-20 NOTE — ED Provider Notes (Signed)
 [facility] Provider Note    Event Date/Time   First MD Initiated Contact with Patient 08/20/24 RONOLD     (approximate)   History   No chief complaint on file.    HPI  Kara Ford is a 33 y.o. female    with a past medical history of pharyngitis, dog bite, bilateral sciatica, chronic pain syndrome, menometrorrhagia,who presents to the ED complaining of rash. According to the patient, symptoms started a week ago with itchiness in the right side of the neck.  Last Friday, 4 days ago patient noticed vesicles on her neck that spread to the right upper chest.  Patient endorses itchiness, pain 9/10.  Patient had previous episode like these in the same area that is spread to the left neck.  Patient was unable to get the vaccine due to her age.   Patient endorses having sore throat, decreased appetite, vomit, cough.  Patient endorses her temperature was 99.9.  Decreased energy.  Patient denies eye compromise.Patient is here by herself.  Patient works as a Stage manager.    There are no active problems to display for this patient.   ROS: Patient currently denies any vision changes, tinnitus, difficulty speaking, facial droop, sore throat, chest pain, shortness of breath, abdominal pain, nausea/vomiting/diarrhea, dysuria, or weakness/numbness/paresthesias in any extremity   Physical Exam   Triage Vital Signs: ED Triage Vitals  Encounter Vitals Group     BP 08/20/24 1810 132/79     Girls Systolic BP Percentile --      Girls Diastolic BP Percentile --      Boys Systolic BP Percentile --      Boys Diastolic BP Percentile --      Pulse Rate 08/20/24 1810 100     Resp 08/20/24 1810 18     Temp 08/20/24 1810 98.7 F (37.1 C)     Temp src --      SpO2 08/20/24 1810 99 %     Weight 08/20/24 1811 168 lb (76.2 kg)     Height 08/20/24 1811 5' 7 (1.702 m)     Head Circumference --      Peak Flow --      Pain Score 08/20/24 1811 9     Pain Loc --      Pain  Education --      Exclude from Growth Chart --     Most recent vital signs: Vitals:   08/20/24 1810  BP: 132/79  Pulse: 100  Resp: 18  Temp: 98.7 F (37.1 C)  SpO2: 99%     Physical Exam Vitals and nursing note reviewed.  During triage vital signs were normal  Constitutional:      General: Awake and alert. No acute distress.  No signs of difficulty breathing    Appearance: Normal appearance. The patient is normal weight.      Able to speak in complete sentences without cough or dyspnea  HENT:     Head: Normocephalic and atraumatic.     Mouth: Mucous membranes are moist.  Peritonsillar erythema, vesicles.  No exudates, no secretions. Neck: Right neck: Presence of macular erythema going from the infra-auricular area to the anterior chest in a linear direction.  No presence of vesicles. Eyes:     General: PERRL. Normal EOMs          Conjunctiva/sclera: Conjunctivae normal.  No presence of calculus, vesicles of crusted lesions. Nose No congestion/rhinorrhea  CV:  Good peripheral perfusion.  Regular rate and rhythm  Resp:               Normal effort.  Equal breath sounds bilaterally.  No wheezing, no crackles Abd:                 No distention.  Soft, nontender.  No rebound or guarding.  Musculoskeletal:        General: No swelling. Normal range of motion.  Skin:    General: Skin is warm and dry.     Capillary Refill: Capillary refill takes less than 2 seconds.     Findings: No rash.  Neurological:     Mental Status: The patient is awake and alert. MAE spontaneously. No gross focal neurologic deficits are appreciated.  Psychiatric Mood and affect are normal. Speech and behavior are normal.  ED Results / Procedures / Treatments   Labs (all labs ordered are listed, but only abnormal results are displayed) Labs Reviewed  RESP PANEL BY RT-PCR (RSV, FLU A&B, COVID)  RVPGX2      PROCEDURES:  Critical Care performed:   Procedures   MEDICATIONS  ORDERED IN ED: Medications  ondansetron  (ZOFRAN -ODT) disintegrating tablet 4 mg (has no administration in time range)  famotidine (PEPCID) tablet 20 mg (20 mg Oral Given 08/20/24 1939)  ibuprofen (ADVIL) tablet 600 mg (600 mg Oral Given 08/20/24 1937)  valACYclovir (VALTREX) tablet 1,000 mg (1,000 mg Oral Given 08/20/24 1941)   Clinical Course as of 08/20/24 2042  Tue Aug 20, 2024  2036 Resp panel by RT-PCR (RSV, Flu A&B, Covid) Anterior Nasal Swab Negative [AE]  2036 I did update patient with results of the respiratory panel, patient is ready for discharge with Aciclovir, cetirizine and ibuprofen.  Patient is agreeable with the plan. [AE]    Clinical Course User Index [AE] Janit Kast, PA-C    IMPRESSION / MDM / ASSESSMENT AND PLAN / ED COURSE  I reviewed the triage vital signs and the nursing notes.  Differential diagnosis includes, but is not limited to, varicella, zoster, COVID, unlikely RSV  Patient's presentation is most consistent with acute complicated illness / injury requiring diagnostic workup.   Kara Ford is a 33 y.o., female who presents today with with history of 1 week of rash in the right neck, 4 days ago development of macular rash that extends from the infra-auricular area to to her chest.  On a physical exam patient is a stable, afebrile.  Evidence of macular rash in a linear fashion going from infra-auricular area to her anterior chest.  No vesicular lesions.  Plan Ice pack Famotidine Aciclovir Respiratory panel Reassessed the patient, patient endorses itchiness decreased but now she is feeling left neck itchiness.  Patient's diagnosis is consistent with zoster. Labs are  reassuring ruling out COVID, RSV, influenza. I did review the patient's allergies and medications.The patient is in stable and satisfactory condition for discharge home  Patient will be discharged home with prescriptions for valacyclovir, capsaicin cream, ibuprofen. Patient is to  follow up with PCP as needed or otherwise directed. Patient is given ED precautions to return to the ED for any worsening or new symptoms. Discussed plan of care with patient, answered all of patient's questions, Patient agreeable to plan of care. Advised patient to take medications according to the instructions on the label. Discussed possible side effects of new medications. Patient verbalized understanding.  FINAL CLINICAL IMPRESSION(S) / ED DIAGNOSES   Final diagnoses:  Herpes zoster without complication  Rx / DC Orders   ED Discharge Orders          Ordered    valACYclovir (VALTREX) 500 MG tablet  2 times daily        08/20/24 2040    Capsaicin-Menthol-Methyl Sal (CAPSAICIN-METHYL SAL-MENTHOL) 0.025-1-12 % CREA  Every 6 hours PRN        08/20/24 2040    ibuprofen (ADVIL) 600 MG tablet  Every 8 hours PRN        08/20/24 2040             Note:  This document was prepared using Dragon voice recognition software and may include unintentional dictation errors.   Janit Kast, PA-C 08/20/24 2043    Levander Slate, MD 08/20/24 [phone]

## 2024-08-20 NOTE — ED Triage Notes (Signed)
 Pt comes in via pov with complaints of a rash that started Friday. Pt states that she suspects that it is shingles. Pt complains of itching at pain at this time. Pt with a rash on the right side of her neck and chest. Pt complains of pain 9/10.
